# Patient Record
Sex: Female | Born: 1975 | Race: White | Hispanic: No | Marital: Single | Smoking: Former smoker
Health system: Southern US, Community
[De-identification: ages and names within clinical notes are randomized; demographics above are authoritative.]

## PROBLEM LIST (undated history)

## (undated) DIAGNOSIS — M1990 Unspecified osteoarthritis, unspecified site: Secondary | ICD-10-CM

## (undated) DIAGNOSIS — E559 Vitamin D deficiency, unspecified: Secondary | ICD-10-CM

## (undated) DIAGNOSIS — F419 Anxiety disorder, unspecified: Secondary | ICD-10-CM

## (undated) DIAGNOSIS — J45909 Unspecified asthma, uncomplicated: Secondary | ICD-10-CM

## (undated) DIAGNOSIS — N911 Secondary amenorrhea: Secondary | ICD-10-CM

## (undated) DIAGNOSIS — E669 Obesity, unspecified: Secondary | ICD-10-CM

## (undated) DIAGNOSIS — T7840XA Allergy, unspecified, initial encounter: Secondary | ICD-10-CM

## (undated) DIAGNOSIS — R7303 Prediabetes: Secondary | ICD-10-CM

## (undated) DIAGNOSIS — Z87891 Personal history of nicotine dependence: Secondary | ICD-10-CM

## (undated) DIAGNOSIS — M069 Rheumatoid arthritis, unspecified: Secondary | ICD-10-CM

## (undated) DIAGNOSIS — N950 Postmenopausal bleeding: Secondary | ICD-10-CM

## (undated) DIAGNOSIS — G4730 Sleep apnea, unspecified: Secondary | ICD-10-CM

## (undated) DIAGNOSIS — R5382 Chronic fatigue, unspecified: Secondary | ICD-10-CM

## (undated) DIAGNOSIS — F988 Other specified behavioral and emotional disorders with onset usually occurring in childhood and adolescence: Secondary | ICD-10-CM

## (undated) HISTORY — DX: Allergy, unspecified, initial encounter: T78.40XA

## (undated) HISTORY — DX: Anxiety disorder, unspecified: F41.9

## (undated) HISTORY — PX: OTHER SURGICAL HISTORY: SHX169

---

## 2015-10-20 ENCOUNTER — Ambulatory Visit
Admission: EM | Admit: 2015-10-20 | Discharge: 2015-10-20 | Disposition: A | Discharge status: Home / Self Care | Payer: BLUE CROSS/BLUE SHIELD | Attending: Family Medicine | Admitting: Family Medicine

## 2015-10-20 ENCOUNTER — Encounter: Payer: Self-pay | Admitting: *Deleted

## 2015-10-20 DIAGNOSIS — J101 Influenza due to other identified influenza virus with other respiratory manifestations: Secondary | ICD-10-CM | POA: Diagnosis not present

## 2015-10-20 HISTORY — DX: Unspecified asthma, uncomplicated: J45.909

## 2015-10-20 LAB — RAPID INFLUENZA A&B ANTIGENS
Influenza A (ARMC): DETECTED
Influenza B (ARMC): NOT DETECTED

## 2015-10-20 LAB — RAPID STREP SCREEN (MED CTR MEBANE ONLY): STREPTOCOCCUS, GROUP A SCREEN (DIRECT): NEGATIVE

## 2015-10-20 MED ORDER — GUAIFENESIN-CODEINE 100-10 MG/5ML PO SOLN: 10.0000 mL | Freq: Four times a day (QID) | ORAL | Status: DC | PRN

## 2015-10-20 MED ORDER — OSELTAMIVIR PHOSPHATE 75 MG PO CAPS: 75.0000 mg | ORAL_CAPSULE | Freq: Two times a day (BID) | ORAL | Status: DC

## 2015-10-20 MED ORDER — ALBUTEROL SULFATE HFA 108 (90 BASE) MCG/ACT IN AERS: 1.0000 | INHALATION_SPRAY | Freq: Four times a day (QID) | RESPIRATORY_TRACT | Status: DC | PRN

## 2015-10-20 NOTE — ED Provider Notes (Signed)
CSN: [id number]     Arrival date & time 10/20/15  4098 History   First MD Initiated Contact with Patient 10/20/15 [phone]     Chief Complaint  Patient presents with  . Cough  . Fever  . Sore Throat  . Generalized Body Aches   (Consider location/radiation/quality/duration/timing/severity/associated sxs/prior Treatment) Patient is a 40 y.o. female presenting with cough, fever, pharyngitis, and URI.  Cough Associated symptoms: fever, headaches, myalgias, rhinorrhea, sore throat and wheezing   Fever Associated symptoms: congestion, cough, headaches, myalgias, rhinorrhea and sore throat   Sore Throat Associated symptoms include headaches.  URI Presenting symptoms: congestion, cough, fatigue, fever, rhinorrhea and sore throat   Severity:  Moderate Onset quality:  Sudden Duration:  2 days Timing:  Constant Progression:  Worsening Chronicity:  New Relieved by:  Nothing Ineffective treatments:  OTC medications Associated symptoms: arthralgias, headaches, myalgias and wheezing   Risk factors: sick contacts   Risk factors: not elderly and no immunosuppression     Past Medical History  Diagnosis Date  . Asthma    Past Surgical History  Procedure Laterality Date  . Right hand repair N/A    History reviewed. No pertinent family history. Social History  Substance Use Topics  . Smoking status: Never Smoker   . Smokeless tobacco: None  . Alcohol Use: No   OB History    No data available     Review of Systems  Constitutional: Positive for fever and fatigue.  HENT: Positive for congestion, rhinorrhea and sore throat.   Respiratory: Positive for cough and wheezing.   Musculoskeletal: Positive for myalgias and arthralgias.  Neurological: Positive for headaches.    Allergies  Review of patient's allergies indicates no known allergies.  Home Medications   Prior to Admission medications   Medication Sig Start Date End Date Taking? Authorizing Provider  cetirizine (ZYRTEC) 10  MG tablet Take 10 mg by mouth daily.   Yes Historical Provider, MD  albuterol (PROVENTIL HFA;VENTOLIN HFA) 108 (90 Base) MCG/ACT inhaler Inhale 1-2 puffs into the lungs every 6 (six) hours as needed for wheezing or shortness of breath. 10/20/15   Payton Mccallum, MD  guaiFENesin-codeine 100-10 MG/5ML syrup Take 10 mLs by mouth every 6 (six) hours as needed for cough. 10/20/15   Payton Mccallum, MD  oseltamivir (TAMIFLU) 75 MG capsule Take 1 capsule (75 mg total) by mouth 2 (two) times daily. 10/20/15   Payton Mccallum, MD   Meds Ordered and Administered this Visit  Medications - No data to display  BP 127/78 mmHg  Pulse 110  Temp(Src) 99.9 F (37.7 C) (Oral)  Resp 20  Ht 5' (1.524 m)  Wt 192 lb (87.091 kg)  BMI 37.50 kg/m2  SpO2 97%  LMP 09/26/2015 (Exact Date) No data found.   Physical Exam  Constitutional: She appears well-developed and well-nourished. No distress.  HENT:  Head: Normocephalic and atraumatic.  Right Ear: Tympanic membrane, external ear and ear canal normal.  Left Ear: Tympanic membrane, external ear and ear canal normal.  Nose: Rhinorrhea present. No nose lacerations, sinus tenderness, nasal deformity, septal deviation or nasal septal hematoma. No epistaxis.  No foreign bodies.  Mouth/Throat: Uvula is midline and mucous membranes are normal. Posterior oropharyngeal erythema present. No oropharyngeal exudate.  Eyes: Conjunctivae and EOM are normal. Pupils are equal, round, and reactive to light. Right eye exhibits no discharge. Left eye exhibits no discharge. No scleral icterus.  Neck: Normal range of motion. Neck supple. No thyromegaly present.  Cardiovascular: Normal rate, regular rhythm  and normal heart sounds.   Pulmonary/Chest: Effort normal and breath sounds normal. No respiratory distress. She has no wheezes. She has no rales.  Lymphadenopathy:    She has no cervical adenopathy.  Neurological: She is alert.  Skin: No rash noted. She is not diaphoretic.  Nursing  note and vitals reviewed.   ED Course  Procedures (including critical care time)  Labs Review Labs Reviewed  RAPID STREP SCREEN (NOT AT [facility])  RAPID INFLUENZA A&B ANTIGENS ([facility] ONLY)  CULTURE, GROUP A STREP [facility])    Imaging Review No results found.   Visual Acuity Review  Right Eye Distance:   Left Eye Distance:   Bilateral Distance:    Right Eye Near:   Left Eye Near:    Bilateral Near:         MDM   1. Influenza A    Discharge Medication List as of 10/20/2015 10:09 AM    START taking these medications   Details  albuterol (PROVENTIL HFA;VENTOLIN HFA) 108 (90 Base) MCG/ACT inhaler Inhale 1-2 puffs into the lungs every 6 (six) hours as needed for wheezing or shortness of breath., Starting 10/20/2015, Until Discontinued, Normal    guaiFENesin-codeine 100-10 MG/5ML syrup Take 10 mLs by mouth every 6 (six) hours as needed for cough., Starting 10/20/2015, Until Discontinued, Print    oseltamivir (TAMIFLU) 75 MG capsule Take 1 capsule (75 mg total) by mouth 2 (two) times daily., Starting 10/20/2015, Until Discontinued, Normal       1. Lab results and diagnosis reviewed with patient 2. rx as per orders above; reviewed possible side effects, interactions, risks and benefits  3. Recommend supportive treatment with otc analgesics, increased fluids 4. Follow-up prn if symptoms worsen or don't improve    Payton Mccallum, MD 10/20/15 1014

## 2015-10-20 NOTE — ED Notes (Signed)
Non-productive cough, fever, sore throat, gen. body aches x2 days.

## 2015-10-22 LAB — CULTURE, GROUP A STREP (THRC)

## 2016-06-20 ENCOUNTER — Ambulatory Visit
Admission: EM | Admit: 2016-06-20 | Discharge: 2016-06-20 | Disposition: A | Discharge status: Home / Self Care | Payer: BC Managed Care – PPO | Attending: Family Medicine | Admitting: Family Medicine

## 2016-06-20 ENCOUNTER — Ambulatory Visit (INDEPENDENT_AMBULATORY_CARE_PROVIDER_SITE_OTHER): Payer: BC Managed Care – PPO

## 2016-06-20 DIAGNOSIS — S5012XA Contusion of left forearm, initial encounter: Secondary | ICD-10-CM
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
## 2016-06-20 NOTE — Discharge Instructions (Signed)
Elevate your arm above your heart and ice the area 20 minutes every 2 hours. Wear your splint for comfort and protection. Use ibuprofen or Tylenol for pain control. If you develop increasing pain or numbness, go to the emergency room. If you are not improving within a week, see your primary care physician

## 2016-06-20 NOTE — ED Triage Notes (Signed)
Patient c/o left arm pain. She sates that some fell on top of her last night .

## 2017-07-26 ENCOUNTER — Other Ambulatory Visit: Payer: Self-pay

## 2017-07-26 ENCOUNTER — Ambulatory Visit
Admission: EM | Admit: 2017-07-26 | Discharge: 2017-07-26 | Disposition: A | Discharge status: Home / Self Care | Payer: BC Managed Care – PPO | Attending: Family Medicine | Admitting: Family Medicine

## 2017-07-26 ENCOUNTER — Encounter: Payer: Self-pay | Admitting: Emergency Medicine

## 2017-07-26 DIAGNOSIS — J069 Acute upper respiratory infection, unspecified: Secondary | ICD-10-CM | POA: Diagnosis not present

## 2017-07-26 DIAGNOSIS — J029 Acute pharyngitis, unspecified: Secondary | ICD-10-CM | POA: Diagnosis not present

## 2017-07-26 DIAGNOSIS — R05 Cough: Secondary | ICD-10-CM | POA: Diagnosis not present

## 2017-07-26 LAB — RAPID STREP SCREEN (MED CTR MEBANE ONLY): STREPTOCOCCUS, GROUP A SCREEN (DIRECT): NEGATIVE

## 2017-07-26 MED ORDER — FLUTICASONE PROPIONATE 50 MCG/ACT NA SUSP: 2.0000 | Freq: Every day | NASAL | 0 refills | Status: DC

## 2017-07-26 MED ORDER — ALBUTEROL SULFATE HFA 108 (90 BASE) MCG/ACT IN AERS: 1.0000 | INHALATION_SPRAY | Freq: Four times a day (QID) | RESPIRATORY_TRACT | 0 refills | Status: DC | PRN

## 2017-07-26 MED ORDER — HYDROCOD POLST-CPM POLST ER 10-8 MG/5ML PO SUER: 5.0000 mL | Freq: Two times a day (BID) | ORAL | 0 refills | Status: DC | PRN

## 2017-07-26 NOTE — ED Provider Notes (Addendum)
[facility]   CSN: [id number] Arrival date & time: 07/26/17  [id number]  History   Chief Complaint Chief Complaint  Patient presents with  . Sinus Problem  . Nasal Congestion  . Cough   HPI  41 year old female presents with the above complaints.  She reports that she has been sick since Sunday.  She has had sore throat, cough, ear pain, congestion, runny nose.  No documented fever.  She is taking over-the-counter Advil Cold and Sinus with improvement.  She feels like her symptoms are worsening however.  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  Symptoms are moderate in severity.  No known exacerbating factors.  No other reported symptoms.  No other complaints at this time.  Past Medical History:  Diagnosis Date  . Asthma    Past Surgical History:  Procedure Laterality Date  . right hand repair N/A    OB History    No data available     Home Medications    Prior to Admission medications   Medication Sig Start Date End Date Taking? Authorizing Provider  cetirizine (ZYRTEC) 10 MG tablet Take 10 mg by mouth daily.   Yes [provider]  albuterol (PROVENTIL HFA;VENTOLIN HFA) 108 (90 Base) MCG/ACT inhaler Inhale 1-2 puffs into the lungs every 6 (six) hours as needed for wheezing or shortness of breath. 07/26/17   Tommie Samsook, Autumne Kallio G, DO  chlorpheniramine-HYDROcodone (TUSSIONEX PENNKINETIC ER) 10-8 MG/5ML SUER Take 5 mLs by mouth every 12 (twelve) hours as needed. 07/26/17   Tommie Samsook, Aanyah Loa G, DO  fluticasone (FLONASE) 50 MCG/ACT nasal spray Place 2 sprays into both nostrils daily. 07/26/17   Tommie Samsook, Camille Dragan G, DO   Family History Family History  Problem Relation Age of Onset  . Hypertension Father    Social History Social History   Tobacco Use  . Smoking status: Never Smoker  . Smokeless tobacco: Never Used  Substance Use Topics  . Alcohol use: No  . Drug use: No   Allergies   Patient has no known allergies.  Review of Systems Review of  Systems  Constitutional: Negative for fever.  HENT: Positive for congestion, ear pain, rhinorrhea and sore throat.   Respiratory: Positive for cough.    Physical Exam Triage Vital Signs ED Triage Vitals  Enc Vitals Group     BP 07/26/17 1015 (!) 118/52     Pulse Rate 07/26/17 1015 71     Resp 07/26/17 1015 16     Temp 07/26/17 1015 97.8 F (36.6 C)     Temp Source 07/26/17 1015 Oral     SpO2 07/26/17 1015 97 %     Weight 07/26/17 1011 184 lb 15.5 oz (83.9 kg)     Height 07/26/17 1011 5' (1.524 m)     Head Circumference --      Peak Flow --      Pain Score 07/26/17 1011 2     Pain Loc --      Pain Edu? --      Excl. in GC? --    No data found.  Updated Vital Signs BP (!) 118/52 (BP Location: Left Arm)   Pulse 71   Temp 97.8 F (36.6 C) (Oral)   Resp 16   Ht 5' (1.524 m)   Wt 184 lb 15.5 oz (83.9 kg)   LMP 07/12/2017 (Approximate)   SpO2 97%   BMI 36.12 kg/m    Physical Exam  Constitutional: She is oriented to  person, place, and time. She appears well-developed. No distress.  HENT:  Head: Normocephalic and atraumatic.  Oropharynx with mild erythema.  No exudate. Normal TM's bilaterally.   Eyes: Conjunctivae are normal.  Neck: Neck supple.  Cardiovascular: Normal rate and regular rhythm.  No murmur heard. Pulmonary/Chest: Effort normal and breath sounds normal. She has no wheezes. She has no rales.  Lymphadenopathy:    She has no cervical adenopathy.  Neurological: She is alert and oriented to person, place, and time. She exhibits normal muscle tone.  Psychiatric: She has a normal mood and affect. Her behavior is normal.  Vitals reviewed.  UC Treatments / Results  Labs (all labs ordered are listed, but only abnormal results are displayed) Labs Reviewed  RAPID STREP SCREEN (NOT AT [facility])  CULTURE, GROUP A STREP [facility])   EKG  EKG Interpretation None      Radiology No results found.  Procedures Procedures (including critical care time)  Medications  Ordered in UC Medications - No data to display   Initial Impression / Assessment and Plan / UC Course  I have reviewed the triage vital signs and the nursing notes.  Pertinent labs & imaging results that were available during my care of the patient were reviewed by me and considered in my medical decision making (see chart for details).    41 year old female presents with a URI. New acute problem. Rapid strep negative.  Treating with flonase, tussionex.  Final Clinical Impressions(s) / UC Diagnoses   Final diagnoses:  Viral upper respiratory tract infection   ED Discharge Orders        Ordered    fluticasone (FLONASE) 50 MCG/ACT nasal spray  Daily     07/26/17 1043    chlorpheniramine-HYDROcodone (TUSSIONEX PENNKINETIC ER) 10-8 MG/5ML SUER  Every 12 hours PRN     07/26/17 1043    albuterol (PROVENTIL HFA;VENTOLIN HFA) 108 (90 Base) MCG/ACT inhaler  Every 6 hours PRN     07/26/17 1051     Controlled Substance Prescriptions [place] Controlled Substance Registry consulted? Not Applicable   Tommie SamsCook, Icker Swigert G, DO 07/26/17 1054    Everlene Otherook, Churchill Grimsley G, DO 07/26/17 1055

## 2017-07-26 NOTE — ED Triage Notes (Signed)
Patient c/o sinus congestion and pressure, cough and nasal congestion since Sunday.

## 2017-07-29 LAB — CULTURE, GROUP A STREP (THRC)

## 2017-12-14 ENCOUNTER — Other Ambulatory Visit: Payer: Self-pay

## 2017-12-14 ENCOUNTER — Ambulatory Visit
Admission: EM | Admit: 2017-12-14 | Discharge: 2017-12-14 | Disposition: A | Discharge status: Home / Self Care | Payer: BC Managed Care – PPO | Attending: Family Medicine | Admitting: Family Medicine

## 2017-12-14 ENCOUNTER — Ambulatory Visit (INDEPENDENT_AMBULATORY_CARE_PROVIDER_SITE_OTHER): Payer: BC Managed Care – PPO

## 2017-12-14 DIAGNOSIS — S99922A Unspecified injury of left foot, initial encounter: Secondary | ICD-10-CM

## 2017-12-14 DIAGNOSIS — W19XXXA Unspecified fall, initial encounter: Secondary | ICD-10-CM | POA: Diagnosis not present

## 2017-12-14 DIAGNOSIS — M79672 Pain in left foot: Secondary | ICD-10-CM

## 2017-12-14 HISTORY — DX: Other specified behavioral and emotional disorders with onset usually occurring in childhood and adolescence: F98.8

## 2017-12-14 NOTE — Discharge Instructions (Signed)
Rest, ice, elevation. ° °Ibuprofen as needed. ° °Take care ° °Dr. Abhiram Criado °

## 2017-12-14 NOTE — ED Provider Notes (Signed)
[facility]    CSN: [id number] Arrival date & time: 12/14/17  1220  History   Chief Complaint Chief Complaint  Patient presents with  . Foot Injury   HPI  42 year old female presents with a foot injury.  Patient states that this morning she had a side board of a bed fall and injure her left foot.  She reports pain of the dorsum of the left foot particularly the lateral aspect.  She has a small cut as well.  Decreased range of motion.  Pain is currently 3/10 in severity.  No medications or interventions tried.  Worse with activity.  No relieving factors.  No other complaints or concerns at this time.  Past Medical History:  Diagnosis Date  . ADD (attention deficit disorder)   . Asthma    Past Surgical History:  Procedure Laterality Date  . right hand repair N/A    OB History   None    Home Medications    Prior to Admission medications   Not on File   Family History Family History  Problem Relation Age of Onset  . Hypertension Father    Social History Social History   Tobacco Use  . Smoking status: Never Smoker  . Smokeless tobacco: Never Used  Substance Use Topics  . Alcohol use: Yes    Comment: social  . Drug use: No     Allergies   Patient has no known allergies.   Review of Systems Review of Systems  Constitutional: Negative.   Musculoskeletal:       Left foot injury, pain.  Skin: Positive for wound.   Physical Exam Triage Vital Signs ED Triage Vitals  Enc Vitals Group     BP 12/14/17 1234 126/70     Pulse Rate 12/14/17 1234 84     Resp 12/14/17 1234 18     Temp 12/14/17 1234 97.9 F (36.6 C)     Temp Source 12/14/17 1234 Oral     SpO2 12/14/17 1234 99 %     Weight --      Height 12/14/17 1235 5' (1.524 m)     Head Circumference --      Peak Flow --      Pain Score 12/14/17 1234 3     Pain Loc --      Pain Edu? --      Excl. in GC? --    Updated Vital Signs BP 126/70 (BP Location: Right Arm)   Pulse 84   Temp 97.9  F (36.6 C) (Oral)   Resp 18   Ht 5' (1.524 m)   LMP 12/05/2017 Comment: denies preg  SpO2 99%   BMI 36.12 kg/m   Physical Exam  Constitutional: She is oriented to person, place, and time. She appears well-developed. No distress.  Cardiovascular: Normal rate and regular rhythm.  Pulmonary/Chest: Effort normal and breath sounds normal. She has no wheezes. She has no rales.  Musculoskeletal:  Left foot - tenderness on the dorsum and the plantar aspect of the foot (distally).  Neurological: She is alert and oriented to person, place, and time.  Skin:  Patient has a small superficial cut at the lateral aspect of the left foot (4th-5th MTP area).  Psychiatric: She has a normal mood and affect. Her behavior is normal.  Nursing note and vitals reviewed.  UC Treatments / Results  Labs (all labs ordered are listed, but only abnormal results are displayed) Labs Reviewed - No data to display  EKG None  Radiology Dg Foot Complete Left  Result Date: 12/14/2017 CLINICAL DATA:  Blunt trauma left foot with pain, initial encounter EXAM: LEFT FOOT - COMPLETE 3+ VIEW COMPARISON:  None. FINDINGS: There is no evidence of fracture or dislocation. There is no evidence of arthropathy or other focal bone abnormality. Soft tissues are unremarkable. IMPRESSION: No acute abnormality noted. Electronically Signed   By: Alcide CleverMark  Lukens M.D.   On: 12/14/2017 13:29    Procedures Procedures (including critical care time)  Medications Ordered in UC Medications - No data to display   Initial Impression / Assessment and Plan / UC Course  I have reviewed the triage vital signs and the nursing notes.  Pertinent labs & imaging results that were available during my care of the patient were reviewed by me and considered in my medical decision making (see chart for details).    42 year old female presents with a foot injury. Xray negative. OTC Ibuprofen as needed. Rest, Ice, Elevation.  Final Clinical  Impressions(s) / UC Diagnoses   Final diagnoses:  Injury of left foot, initial encounter    ED Discharge Orders    None     Controlled Substance Prescriptions [place] Controlled Substance Registry consulted? Not Applicable   Tommie SamsCook, Sahalie Beth G, DO 12/14/17 1359

## 2017-12-14 NOTE — ED Triage Notes (Signed)
Pt reports her headboard fell over on top of her left foot. Pain 3/10

## 2018-04-02 ENCOUNTER — Encounter: Payer: Self-pay | Admitting: Emergency Medicine

## 2018-04-02 ENCOUNTER — Ambulatory Visit
Admission: EM | Admit: 2018-04-02 | Discharge: 2018-04-02 | Disposition: A | Discharge status: Home / Self Care | Payer: BC Managed Care – PPO | Attending: Family Medicine | Admitting: Family Medicine

## 2018-04-02 ENCOUNTER — Other Ambulatory Visit: Payer: Self-pay

## 2018-04-02 DIAGNOSIS — R05 Cough: Secondary | ICD-10-CM | POA: Diagnosis not present

## 2018-04-02 DIAGNOSIS — J029 Acute pharyngitis, unspecified: Secondary | ICD-10-CM | POA: Diagnosis not present

## 2018-04-02 LAB — RAPID STREP SCREEN (MED CTR MEBANE ONLY): STREPTOCOCCUS, GROUP A SCREEN (DIRECT): NEGATIVE

## 2018-04-02 MED ORDER — AMOXICILLIN 875 MG PO TABS: 875.0000 mg | ORAL_TABLET | Freq: Two times a day (BID) | ORAL | 0 refills | Status: DC

## 2018-04-02 MED ORDER — CETIRIZINE-PSEUDOEPHEDRINE ER 5-120 MG PO TB12: 1.0000 | ORAL_TABLET | Freq: Two times a day (BID) | ORAL | 0 refills | Status: DC

## 2018-04-02 NOTE — ED Triage Notes (Signed)
Patient c/o sore throat that started this morning.  Patient denies fevers.  

## 2018-04-02 NOTE — ED Provider Notes (Signed)
[facility]    CSN: [id number] Arrival date & time: 04/02/18  1219     History   Chief Complaint Chief Complaint  Patient presents with  . Sore Throat    HPI Stacy Ross is a 42 y.o. female.   HPI  42 year old female presents with a sore throat that started this morning.  Denies Any fevers.  She has  a mild cough and drainage.  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  Afebrile today.  Did have chills last night.        Past Medical History:  Diagnosis Date  . ADD (attention deficit disorder)   . Asthma     There are no active problems to display for this patient.   Past Surgical History:  Procedure Laterality Date  . right hand repair N/A     OB History   None      Home Medications    Prior to Admission medications   Medication Sig Start Date End Date Taking? Authorizing Provider  amoxicillin (AMOXIL) 875 MG tablet Take 1 tablet (875 mg total) by mouth 2 (two) times daily. 04/02/18   Lutricia Feiloemer, William P, PA-C  cetirizine-pseudoephedrine (ZYRTEC-D) 5-120 MG tablet Take 1 tablet by mouth 2 (two) times daily. 04/02/18   Lutricia Feiloemer, William P, PA-C    Family History Family History  Problem Relation Age of Onset  . Hypertension Father     Social History Social History   Tobacco Use  . Smoking status: Never Smoker  . Smokeless tobacco: Never Used  Substance Use Topics  . Alcohol use: Yes    Comment: social  . Drug use: No     Allergies   Patient has no known allergies.   Review of Systems Review of Systems  Constitutional: Positive for activity change and chills. Negative for fatigue and fever.  HENT: Positive for congestion, postnasal drip and sore throat.   Respiratory: Positive for cough.   All other systems reviewed and are negative.    Physical Exam Triage Vital Signs ED Triage Vitals  Enc Vitals Group     BP 04/02/18 1230 113/78     Pulse  Rate 04/02/18 1230 85     Resp 04/02/18 1230 16     Temp 04/02/18 1230 98.3 F (36.8 C)     Temp Source 04/02/18 1230 Oral     SpO2 04/02/18 1230 99 %     Weight 04/02/18 1227 190 lb (86.2 kg)     Height 04/02/18 1227 5' (1.524 m)     Head Circumference --      Peak Flow --      Pain Score 04/02/18 1227 0     Pain Loc --      Pain Edu? --      Excl. in GC? --    No data found.  Updated Vital Signs BP 113/78 (BP Location: Right Arm)   Pulse 85   Temp 98.3 F (36.8 C) (Oral)   Resp 16   Ht 5' (1.524 m)   Wt 190 lb (86.2 kg)   LMP 02/27/2018 (Exact Date)   SpO2 99%   BMI 37.11 kg/m   Visual Acuity Right Eye Distance:   Left Eye Distance:   Bilateral Distance:    Right Eye Near:   Left Eye Near:    Bilateral Near:     Physical Exam  Constitutional: She is  oriented to person, place, and time. She appears well-developed and well-nourished.  Non-toxic appearance. She does not appear ill. No distress.  HENT:  Head: Normocephalic.  Right Ear: Hearing, tympanic membrane and ear canal normal.  Left Ear: Hearing, tympanic membrane and ear canal normal.  Mouth/Throat: Oropharynx is clear and moist and mucous membranes are normal. No oral lesions. No uvula swelling. No oropharyngeal exudate, posterior oropharyngeal edema, posterior oropharyngeal erythema or tonsillar abscesses. Tonsils are 0 on the right. Tonsils are 1+ on the left. No tonsillar exudate.  Eyes: Pupils are equal, round, and reactive to light.  Neck: Normal range of motion.  Pulmonary/Chest: Effort normal and breath sounds normal.  Neurological: She is alert and oriented to person, place, and time.  Skin: Skin is warm and dry.  Psychiatric: She has a normal mood and affect. Her behavior is normal.  Nursing note and vitals reviewed.    UC Treatments / Results  Labs (all labs ordered are listed, but only abnormal results are displayed) Labs Reviewed  RAPID STREP SCREEN ([facility] ONLY)  CULTURE,  GROUP A STREP [facility])    EKG None  Radiology No results found.  Procedures Procedures (including critical care time)  Medications Ordered in UC Medications - No data to display  Initial Impression / Assessment and Plan / UC Course  I have reviewed the triage vital signs and the nursing notes.  Pertinent labs & imaging results that were available during my care of the patient were reviewed by me and considered in my medical decision making (see chart for details).     Plan: 1. Test/x-ray results and diagnosis reviewed with patient 2. rx as per orders; risks, benefits, potential side effects reviewed with patient 3. Recommend supportive treatment with use of Flonase daily and Zyrtec-D.  She is leaving on a trip to [place] driving at the [employer] [place].  As a courtesy I will prescribe amoxicillin if the cultures and sensitivities returned as a positive for strep.  If it is not strep, I have told her that it is a viral pharyngitis and will have to run its course. 4. F/u prn if symptoms worsen or don't improve  Final Clinical Impressions(s) / UC Diagnoses   Final diagnoses:  Sore throat     Discharge Instructions     Use Flonase daily.  Take Zyrtec-D for 1 to 2 weeks for congestion.  Use amoxicillin as prescribed if you are notified that the cultures have been positive otherwise this is likely a viral illness does not require antibiotics.    ED Prescriptions    Medication Sig Dispense Auth. Provider   amoxicillin (AMOXIL) 875 MG tablet Take 1 tablet (875 mg total) by mouth 2 (two) times daily. 20 tablet Ovid Curd P, PA-C   cetirizine-pseudoephedrine (ZYRTEC-D) 5-120 MG tablet Take 1 tablet by mouth 2 (two) times daily. 30 tablet Lutricia Feil, PA-C     Controlled Substance Prescriptions [place] Controlled Substance Registry consulted? Not Applicable   Lutricia Feil, PA-C 04/02/18 1332

## 2018-04-02 NOTE — Discharge Instructions (Signed)
Use Flonase daily.  Take Zyrtec-D for 1 to 2 weeks for congestion.  Use amoxicillin as prescribed if you are notified that the cultures have been positive otherwise this is likely a viral illness does not require antibiotics.

## 2018-04-05 LAB — CULTURE, GROUP A STREP (THRC)

## 2019-04-29 ENCOUNTER — Other Ambulatory Visit: Payer: Self-pay

## 2019-04-29 DIAGNOSIS — Z20822 Contact with and (suspected) exposure to covid-19: Secondary | ICD-10-CM

## 2019-04-30 LAB — NOVEL CORONAVIRUS, NAA: SARS-CoV-2, NAA: NOT DETECTED

## 2019-07-11 ENCOUNTER — Other Ambulatory Visit: Payer: Self-pay

## 2019-07-11 DIAGNOSIS — Z20822 Contact with and (suspected) exposure to covid-19: Secondary | ICD-10-CM

## 2019-07-12 LAB — NOVEL CORONAVIRUS, NAA: SARS-CoV-2, NAA: NOT DETECTED

## 2021-04-12 ENCOUNTER — Other Ambulatory Visit: Payer: Self-pay

## 2021-04-12 ENCOUNTER — Ambulatory Visit
Admission: EM | Admit: 2021-04-12 | Discharge: 2021-04-12 | Disposition: A | Discharge status: Home / Self Care | Payer: BC Managed Care – PPO | Attending: Family Medicine | Admitting: Family Medicine

## 2021-04-12 DIAGNOSIS — F17210 Nicotine dependence, cigarettes, uncomplicated: Secondary | ICD-10-CM | POA: Insufficient documentation

## 2021-04-12 DIAGNOSIS — J029 Acute pharyngitis, unspecified: Secondary | ICD-10-CM | POA: Insufficient documentation

## 2021-04-12 DIAGNOSIS — R197 Diarrhea, unspecified: Secondary | ICD-10-CM | POA: Insufficient documentation

## 2021-04-12 DIAGNOSIS — Z20822 Contact with and (suspected) exposure to covid-19: Secondary | ICD-10-CM | POA: Insufficient documentation

## 2021-04-12 DIAGNOSIS — R112 Nausea with vomiting, unspecified: Secondary | ICD-10-CM | POA: Insufficient documentation

## 2021-04-12 DIAGNOSIS — H9203 Otalgia, bilateral: Secondary | ICD-10-CM | POA: Diagnosis not present

## 2021-04-12 DIAGNOSIS — J069 Acute upper respiratory infection, unspecified: Secondary | ICD-10-CM | POA: Insufficient documentation

## 2021-04-12 LAB — SARS CORONAVIRUS 2 (TAT 6-24 HRS): SARS Coronavirus 2: NEGATIVE

## 2021-04-12 LAB — GROUP A STREP BY PCR: Group A Strep by PCR: NOT DETECTED

## 2021-04-12 MED ORDER — BENZONATATE 100 MG PO CAPS: 200.0000 mg | ORAL_CAPSULE | Freq: Three times a day (TID) | ORAL | 0 refills | Status: DC

## 2021-04-12 MED ORDER — IPRATROPIUM BROMIDE 0.06 % NA SOLN: 2.0000 | Freq: Four times a day (QID) | NASAL | 12 refills | Status: DC

## 2021-04-12 MED ORDER — ONDANSETRON 8 MG PO TBDP: 8.0000 mg | ORAL_TABLET | Freq: Three times a day (TID) | ORAL | 0 refills | Status: DC | PRN

## 2021-04-12 MED ORDER — PROMETHAZINE-DM 6.25-15 MG/5ML PO SYRP: 5.0000 mL | ORAL_SOLUTION | Freq: Four times a day (QID) | ORAL | 0 refills | Status: DC | PRN

## 2021-04-12 NOTE — ED Provider Notes (Signed)
[facility]    CSN: [id number] Arrival date & time: 04/12/21  0845      History   Chief Complaint Chief Complaint  Patient presents with   Sore Throat   Fever    HPI Stacy Ross is a 45 y.o. female.   HPI  45 year old female here for evaluation of sore throat and fever.  Patient reports that she developed a sore throat and a fever of 101.12 days ago.  Since then she has also developed fatigue, nasal congestion, bilateral ear pain, nonproductive cough, nausea, vomiting, and diarrhea.  She denies any runny nose, shortness of breath, or wheezing.  She did have COVID at the end of April and she is vaccinated.  Past Medical History:  Diagnosis Date   ADD (attention deficit disorder)    Asthma     There are no problems to display for this patient.   Past Surgical History:  Procedure Laterality Date   right hand repair N/A     OB History   No obstetric history on file.      Home Medications    Prior to Admission medications   Medication Sig Start Date End Date Taking? Authorizing Provider  benzonatate (TESSALON) 100 MG capsule Take 2 capsules (200 mg total) by mouth every 8 (eight) hours. 04/12/21  Yes Becky Augusta, NP  cetirizine-pseudoephedrine (ZYRTEC-D) 5-120 MG tablet Take 1 tablet by mouth 2 (two) times daily. 04/02/18  Yes Ovid Curd P, PA-C  ipratropium (ATROVENT) 0.06 % nasal spray Place 2 sprays into both nostrils 4 (four) times daily. 04/12/21  Yes Becky Augusta, NP  ondansetron (ZOFRAN ODT) 8 MG disintegrating tablet Take 1 tablet (8 mg total) by mouth every 8 (eight) hours as needed for nausea or vomiting. 04/12/21  Yes Becky Augusta, NP  promethazine-dextromethorphan (PROMETHAZINE-DM) 6.25-15 MG/5ML syrup Take 5 mLs by mouth 4 (four) times daily as needed. 04/12/21  Yes Becky Augusta, NP    Family History Family History  Problem Relation Age of Onset   Hypertension Father     Social History Social History   Tobacco Use   Smoking status:  Every Day    Types: Cigarettes   Smokeless tobacco: Never  Vaping Use   Vaping Use: Never used  Substance Use Topics   Alcohol use: Yes    Comment: social   Drug use: No     Allergies   Patient has no known allergies.   Review of Systems Review of Systems  Constitutional:  Positive for fatigue and fever. Negative for activity change and appetite change.  HENT:  Positive for congestion, ear pain and sore throat. Negative for rhinorrhea.   Respiratory:  Positive for cough. Negative for shortness of breath and wheezing.   Gastrointestinal:  Positive for diarrhea, nausea and vomiting.  Skin:  Negative for rash.  Hematological: Negative.   Psychiatric/Behavioral: Negative.      Physical Exam Triage Vital Signs ED Triage Vitals  Enc Vitals Group     BP 04/12/21 0935 139/89     Pulse Rate 04/12/21 0935 77     Resp 04/12/21 0935 16     Temp 04/12/21 0935 98.7 F (37.1 C)     Temp Source 04/12/21 0935 Oral     SpO2 04/12/21 0935 96 %     Weight 04/12/21 0933 200 lb (90.7 kg)     Height 04/12/21 0933 5' (1.524 m)     Head Circumference --      Peak Flow --  Pain Score 04/12/21 0933 1     Pain Loc --      Pain Edu? --      Excl. in GC? --    No data found.  Updated Vital Signs BP 139/89 (BP Location: Left Arm)   Pulse 77   Temp 98.7 F (37.1 C) (Oral)   Resp 16   Ht 5' (1.524 m)   Wt 200 lb (90.7 kg)   LMP 12/14/2020   SpO2 96%   BMI 39.06 kg/m   Visual Acuity Right Eye Distance:   Left Eye Distance:   Bilateral Distance:    Right Eye Near:   Left Eye Near:    Bilateral Near:     Physical Exam Vitals and nursing note reviewed.  Constitutional:      General: She is not in acute distress.    Appearance: Normal appearance. She is obese. She is not ill-appearing.  HENT:     Head: Normocephalic and atraumatic.     Right Ear: Tympanic membrane, ear canal and external ear normal. There is no impacted cerumen.     Left Ear: Tympanic membrane, ear canal  and external ear normal. There is no impacted cerumen.     Nose: Congestion present. No rhinorrhea.     Mouth/Throat:     Mouth: Mucous membranes are moist.     Pharynx: Oropharynx is clear. Posterior oropharyngeal erythema present. No oropharyngeal exudate.  Cardiovascular:     Rate and Rhythm: Normal rate and regular rhythm.     Pulses: Normal pulses.     Heart sounds: Normal heart sounds. No murmur heard.   No gallop.  Pulmonary:     Effort: Pulmonary effort is normal.     Breath sounds: No wheezing, rhonchi or rales.  Musculoskeletal:     Cervical back: Normal range of motion and neck supple.  Lymphadenopathy:     Cervical: Cervical adenopathy present.  Skin:    General: Skin is warm and dry.     Capillary Refill: Capillary refill takes less than 2 seconds.     Findings: No erythema or rash.  Neurological:     General: No focal deficit present.     Mental Status: She is alert and oriented to person, place, and time.  Psychiatric:        Mood and Affect: Mood normal.        Behavior: Behavior normal.        Thought Content: Thought content normal.        Judgment: Judgment normal.     UC Treatments / Results  Labs (all labs ordered are listed, but only abnormal results are displayed) Labs Reviewed  GROUP A STREP BY PCR  SARS CORONAVIRUS 2 (TAT 6-24 HRS)    EKG   Radiology No results found.  Procedures Procedures (including critical care time)  Medications Ordered in UC Medications - No data to display  Initial Impression / Assessment and Plan / UC Course  I have reviewed the triage vital signs and the nursing notes.  Pertinent labs & imaging results that were available during my care of the patient were reviewed by me and considered in my medical decision making (see chart for details).  Patient is a very pleasant, nontoxic-appearing 45 year old female here for evaluation of COVID/flulike symptoms that been going on for the past 2 days.  Patient reports  that she started feeling bad 3 days ago, took a home COVID test which was negative, and then went to a party  with the [facility] [employer] hot football team.  When she returned home from the party she states that she was profoundly fatigued and then woke up with 101.1 fever, sore throat, nausea, vomiting, and diarrhea.  Since then she has developed nasal congestion and bilateral ear pain that she is rating is a 1/10.  Today she developed a nonproductive cough.  She denies runny nose, shortness breath, or wheezing.  Patient's physical exam reveals pearly gray tympanic membranes bilaterally with a normal light reflex and clear external auditory canals.  Nasal mucosa is mildly erythematous edematous without discharge.  Oropharyngeal exam reveals 2+ tonsillar pillar edema with erythema but no exudate.  There is no postnasal drip or posterior oropharyngeal erythema or injection noted.  Patient does have bilateral mild cervical lymphadenopathy anteriorly.  Cardiopulmonary exam reveals clear lung sounds in all fields.  No rashes noted.  Patient swab for COVID at triage as well as a strep PCR was collected.  Strep PCR is negative.  Will discharge patient home to isolate pending result of her COVID test.  We will give patient Zofran to help with nausea and vomiting, Atrovent nasal spray to help with nasal congestion, Tessalon Perles and Promethazine DM to help with cough and congestion.   Final Clinical Impressions(s) / UC Diagnoses   Final diagnoses:  Upper respiratory tract infection, unspecified type     Discharge Instructions      Isolate at home pending the results of your COVID test.  If you test positive then you will have to quarantine for 5 days from the start of your symptoms.  After 5 days you can break quarantine if your symptoms have improved and you have not had a fever for 24 hours without taking Tylenol or ibuprofen.  Use over-the-counter Tylenol and ibuprofen as needed for body aches and fever.  Use  the Atrovent nasal spray, 2 squirts up each nostril every 6 hours, as needed for nasal congestion.  Use the Tessalon Perles during the day as needed for cough and the Promethazine DM cough syrup at nighttime as will make you drowsy.  Use the Zofran ODT every 8 hours as needed for nausea and vomiting.  If you develop any increased shortness of breath-especially at rest, you are unable to speak in full sentences, or is a late sign your lips are turning blue you need to go the ER for evaluation.      ED Prescriptions     Medication Sig Dispense Auth. Provider   benzonatate (TESSALON) 100 MG capsule Take 2 capsules (200 mg total) by mouth every 8 (eight) hours. 21 capsule Becky Augusta, NP   ipratropium (ATROVENT) 0.06 % nasal spray Place 2 sprays into both nostrils 4 (four) times daily. 15 mL Becky Augusta, NP   promethazine-dextromethorphan (PROMETHAZINE-DM) 6.25-15 MG/5ML syrup Take 5 mLs by mouth 4 (four) times daily as needed. 118 mL Becky Augusta, NP   ondansetron (ZOFRAN ODT) 8 MG disintegrating tablet Take 1 tablet (8 mg total) by mouth every 8 (eight) hours as needed for nausea or vomiting. 20 tablet Becky Augusta, NP      PDMP not reviewed this encounter.   Becky Augusta, NP 04/12/21 1042

## 2021-04-12 NOTE — Discharge Instructions (Addendum)
Isolate at home pending the results of your COVID test.  If you test positive then you will have to quarantine for 5 days from the start of your symptoms.  After 5 days you can break quarantine if your symptoms have improved and you have not had a fever for 24 hours without taking Tylenol or ibuprofen.  Use over-the-counter Tylenol and ibuprofen as needed for body aches and fever.  Use the Atrovent nasal spray, 2 squirts up each nostril every 6 hours, as needed for nasal congestion.  Use the Tessalon Perles during the day as needed for cough and the Promethazine DM cough syrup at nighttime as will make you drowsy.  Use the Zofran ODT every 8 hours as needed for nausea and vomiting.  If you develop any increased shortness of breath-especially at rest, you are unable to speak in full sentences, or is a late sign your lips are turning blue you need to go the ER for evaluation.

## 2021-04-12 NOTE — ED Triage Notes (Signed)
Pt here with C/O sore throat since Saturday took at home test was negative, started running a fever saturday night.

## 2021-05-24 ENCOUNTER — Ambulatory Visit: Payer: BC Managed Care – PPO | Admitting: Family Medicine

## 2021-05-24 ENCOUNTER — Encounter: Payer: Self-pay | Admitting: Family Medicine

## 2021-05-24 ENCOUNTER — Other Ambulatory Visit: Payer: Self-pay

## 2021-05-24 VITALS — BP 120/80 | HR 76 | Ht 59.0 in | Wt 210.0 lb

## 2021-05-24 DIAGNOSIS — R5383 Other fatigue: Secondary | ICD-10-CM | POA: Diagnosis not present

## 2021-05-24 DIAGNOSIS — N912 Amenorrhea, unspecified: Secondary | ICD-10-CM

## 2021-05-24 DIAGNOSIS — Z23 Encounter for immunization: Secondary | ICD-10-CM

## 2021-05-24 DIAGNOSIS — R635 Abnormal weight gain: Secondary | ICD-10-CM | POA: Diagnosis not present

## 2021-05-24 DIAGNOSIS — Z7689 Persons encountering health services in other specified circumstances: Secondary | ICD-10-CM | POA: Diagnosis not present

## 2021-05-24 NOTE — Progress Notes (Signed)
Date:  05/24/2021   Name:  Stacy Ross   DOB:  [birth year]   MRN:  [id number]   Chief Complaint: Establish Care  Patient is a 45 year old female who presents for a comprehensive physical exam. The patient reports the following problems: none. Health maintenance has been reviewed need pap     No results found for: CREATININE, BUN, NA, K, CL, CO2 No results found for: CHOL, HDL, LDLCALC, LDLDIRECT, TRIG, CHOLHDL No results found for: TSH No results found for: HGBA1C No results found for: WBC, HGB, HCT, MCV, PLT No results found for: ALT, AST, GGT, ALKPHOS, BILITOT   Review of Systems  Constitutional:  Negative for chills and fever.  HENT:  Negative for drooling, ear discharge, ear pain and sore throat.   Respiratory:  Negative for cough, shortness of breath and wheezing.   Cardiovascular:  Negative for chest pain, palpitations and leg swelling.  Gastrointestinal:  Negative for abdominal pain, blood in stool, constipation, diarrhea and nausea.  Endocrine: Negative for polydipsia.  Genitourinary:  Negative for dysuria, frequency, hematuria and urgency.  Musculoskeletal:  Negative for back pain, myalgias and neck pain.  Skin:  Negative for rash.  Allergic/Immunologic: Negative for environmental allergies.  Neurological:  Negative for dizziness and headaches.  Hematological:  Does not bruise/bleed easily.  Psychiatric/Behavioral:  Negative for suicidal ideas. The patient is not nervous/anxious.    There are no problems to display for this patient.   No Known Allergies  Past Surgical History:  Procedure Laterality Date   right hand repair N/A     Social History   Tobacco Use   Smoking status: Former    Packs/day: 0.25    Years: 10.00    Pack years: 2.50    Types: Cigarettes    Quit date: 03/05/2021    Years since quitting: 0.2   Smokeless tobacco: Never  Vaping Use   Vaping Use: Never used  Substance Use Topics   Alcohol use: Yes    Comment: social   Drug use: No      Medication list has been reviewed and updated.  No outpatient medications have been marked as taking for the 05/24/21 encounter (Office Visit) with Duanne Limerick, MD.    [facility] 2/9 Scores 05/24/2021  PHQ - 2 Score 0  PHQ- 9 Score 0    GAD 7 : Generalized Anxiety Score 05/24/2021  Nervous, Anxious, on Edge 2  Control/stop worrying 1  Worry too much - different things 2  Trouble relaxing 1  Restless 1  Easily annoyed or irritable 1  Afraid - awful might happen 1  Total GAD 7 Score 9  Anxiety Difficulty Very difficult    BP Readings from Last 3 Encounters:  05/24/21 120/80  04/12/21 139/89  04/02/18 113/78    Physical Exam Vitals and nursing note reviewed.    Wt Readings from Last 3 Encounters:  05/24/21 210 lb (95.3 kg)  04/12/21 200 lb (90.7 kg)  04/02/18 190 lb (86.2 kg)    BP 120/80   Pulse 76   Ht 4\' 11"  (1.499 m)   Wt 210 lb (95.3 kg)   BMI 42.41 kg/m   Assessment and Plan:  1. Establishing care with new doctor, encounter for Patient establishing care with new physician.  Patient's previous encounters were reviewed as well as most recent labs most recent imaging in Care Everywhere.  2. Amenorrhea New onset over the past 8 months patient has had only 2 periods that have been normal  in duration and amount.  There is multiple reasons that patient may not be having periods on a regular basis including her recent weight gain.  We will check for thyroid concerns along with renal function panel and CBC to see if there is an underlying concern and referred to OB/GYN for Pap smears and evaluation of amenorrhea. - Ambulatory referral to Obstetrics / Gynecology  3. Weight gain Patient's had a significant weight gain over the past 3 to 4 years which she has attributed to COVID and other concerns.  We will begin with looking at her thyroid function to confirm that she is not hypothyroid. - Thyroid Panel With TSH - Renal Function Panel - CBC with  Differential/Platelet  4. Fatigue, unspecified type As noted above patient also has had some fatigue in retrospect this may be secondary to concerns of depression and also patient relates that Once upon a time that she was ADHD. - Renal Function Panel - CBC with Differential/Platelet  5. Need for immunization against influenza Discussed and administered. - Flu Vaccine QUAD 18mo+IM (Fluarix, Fluzone & Alfiuria Quad PF)

## 2021-05-24 NOTE — Patient Instructions (Addendum)
Secondary Amenorrhea Secondary amenorrhea occurs when a female who was previously having menstrual periods has not had them for 3-6 months. A menstrual period is the monthly shedding of the lining of the uterus. The lining of the uterus is made up of blood, tissue, fluid, and mucus. The flow of blood usually occurs during 3-7 consecutive days each month. This condition has many causes. In many cases, treating the underlying cause will return menstrual periods back to a normal cycle. What are the causes? The most common cause of this condition is pregnancy. Other medical conditions that can cause secondary amenorrhea include: Cirrhosis of the liver. Conditions of the blood. Diabetes. Epilepsy. Chronic kidney disease. Polycystic ovary disease. A hormonal imbalance. Ovarian failure. Cystic fibrosis. Early menopause. Cushing syndrome. Thyroid problems. Other causes may include: Malnutrition. Stress or anxiety. Medicines. Extreme obesity. Low body weight or drastic weight loss. Removal of the ovaries or uterus. Contraceptive pills, patches, or vaginal rings. What increases the risk? You are more likely to develop this condition if: You have a family history of this condition. You have an eating disorder. You do extreme athletic training. You have a chronic disease. You abuse substances such as alcohol or cigarettes. What are the signs or symptoms? The main symptom of this condition is a lack of menstrual periods for 3-6 months in a female who previously had menstrual periods. How is this diagnosed? This condition may be diagnosed based on: Your medical history. A physical exam. A pelvic exam to check for problems with your reproductive organs. A procedure to examine the uterus. A measurement of your body mass index (BMI). You may also have other tests, including: Blood tests that measure certain hormones in your body and rule out pregnancy. Urine tests. Imaging tests, such as  an ultrasound, CT scan, or MRI. How is this treated? Treatment for this condition depends on the cause of the amenorrhea. It may involve: Correcting diet-related problems. Treating underlying conditions. Medicines. Lifestyle changes. Surgery. If the condition cannot be corrected, it is sometimes possible to start menstrual periods with medicines. Follow these instructions at home: Lifestyle   Maintain a healthy diet. In general, a healthy diet includes lots of fruits and vegetables, low-fat dairy products, lean meats, and foods that contain fiber. Ask to meet with a registered dietitian for nutrition counseling and meal planning. Maintain a healthy weight. Talk to your health care provider before trying any new diet or exercise plan. Exercise at least 30 minutes 5 or more days each week. Exercising includes brisk walking, yard work, biking, running, swimming, and team sports like basketball and soccer. Ask your health care provider which exercises are safe for you. Get enough sleep. Plan your sleep time to allow for 7-9 hours of sleep each night. Learn to manage stress. Explore relaxation techniques such as meditation, journaling, yoga, or tai chi. General instructions Be aware of changes in your menstrual cycle. Keep a record of when you have your menstrual period. Note the date your period starts, how long it lasts, and any problems you experience. Take over-the-counter and prescription medicines only as told by your health care provider. Keep all follow-up visits. This is important. Contact a health care provider if: Your periods do not return to normal after treatment. Summary Secondary amenorrhea is when a female who was previously having menstrual periods has not gotten her period for 3-6 months. This condition has many causes. In many cases, treating the underlying cause will return menstrual periods back to a normal cycle. Talk  to your health care provider if your periods do not  return to normal after treatment. This information is not intended to replace advice given to you by your health care provider. Make sure you discuss any questions you have with your health care provider. Document Revised: 04/08/2020 Document Reviewed: 04/08/2020 Elsevier Patient Education  2022 [employer] Calories are units of energy. Your body needs a certain number of calories from food to keep going throughout the day. When you eat or drink more calories than your body needs, your body stores the extra calories mostly as fat. When you eat or drink fewer calories than your body needs, your body burns fat to get the energy it needs. Calorie counting means keeping track of how many calories you eat and drink each day. Calorie counting can be helpful if you need to lose weight. If you eat fewer calories than your body needs, you should lose weight. Ask your health care provider what a healthy weight is for you. For calorie counting to work, you will need to eat the right number of calories each day to lose a healthy amount of weight per week. A dietitian can help you figure out how many calories you need in a day and will suggest ways to reach your calorie goal. A healthy amount of weight to lose each week is usually 1-2 lb (0.5-0.9 kg). This usually means that your daily calorie intake should be reduced by 500-750 calories. Eating 1,200-1,500 calories a day can help most women lose weight. Eating 1,500-1,800 calories a day can help most men lose weight. What do I need to know about calorie counting? Work with your health care provider or dietitian to determine how many calories you should get each day. To meet your daily calorie goal, you will need to: Find out how many calories are in each food that you would like to eat. Try to do this before you eat. Decide how much of the food you plan to eat. Keep a food log. Do this by writing down what you ate and how many  calories it had. To successfully lose weight, it is important to balance calorie counting with a healthy lifestyle that includes regular activity. Where do I find calorie information? The number of calories in a food can be found on a Nutrition Facts label. If a food does not have a Nutrition Facts label, try to look up the calories online or ask your dietitian for help. Remember that calories are listed per serving. If you choose to have more than one serving of a food, you will have to multiply the calories per serving by the number of servings you plan to eat. For example, the label on a package of bread might say that a serving size is 1 slice and that there are 90 calories in a serving. If you eat 1 slice, you will have eaten 90 calories. If you eat 2 slices, you will have eaten 180 calories. How do I keep a food log? After each time that you eat, record the following in your food log as soon as possible: What you ate. Be sure to include toppings, sauces, and other extras on the food. How much you ate. This can be measured in cups, ounces, or number of items. How many calories were in each food and drink. The total number of calories in the food you ate. Keep your food log near you, such as in a pocket-sized notebook or  on an app or website on your mobile phone. Some programs will calculate calories for you and show you how many calories you have left to meet your daily goal. What are some portion-control tips? Know how many calories are in a serving. This will help you know how many servings you can have of a certain food. Use a measuring cup to measure serving sizes. You could also try weighing out portions on a kitchen scale. With time, you will be able to estimate serving sizes for some foods. Take time to put servings of different foods on your favorite plates or in your favorite bowls and cups so you know what a serving looks like. Try not to eat straight from a food's packaging, such as  from a bag or box. Eating straight from the package makes it hard to see how much you are eating and can lead to overeating. Put the amount you would like to eat in a cup or on a plate to make sure you are eating the right portion. Use smaller plates, glasses, and bowls for smaller portions and to prevent overeating. Try not to multitask. For example, avoid watching TV or using your computer while eating. If it is time to eat, sit down at a table and enjoy your food. This will help you recognize when you are full. It will also help you be more mindful of what and how much you are eating. What are tips for following this plan? Reading food labels Check the calorie count compared with the serving size. The serving size may be smaller than what you are used to eating. Check the source of the calories. Try to choose foods that are high in protein, fiber, and vitamins, and low in saturated fat, trans fat, and sodium. Shopping Read nutrition labels while you shop. This will help you make healthy decisions about which foods to buy. Pay attention to nutrition labels for low-fat or fat-free foods. These foods sometimes have the same number of calories or more calories than the full-fat versions. They also often have added sugar, starch, or salt to make up for flavor that was removed with the fat. Make a grocery list of lower-calorie foods and stick to it. Cooking Try to cook your favorite foods in a healthier way. For example, try baking instead of frying. Use low-fat dairy products. Meal planning Use more fruits and vegetables. One-half of your plate should be fruits and vegetables. Include lean proteins, such as chicken, [place], and fish. Lifestyle Each week, aim to do one of the following: 150 minutes of moderate exercise, such as walking. 75 minutes of vigorous exercise, such as running. General information Know how many calories are in the foods you eat most often. This will help you calculate  calorie counts faster. Find a way of tracking calories that works for you. Get creative. Try different apps or programs if writing down calories does not work for you. What foods should I eat?  Eat nutritious foods. It is better to have a nutritious, high-calorie food, such as an avocado, than a food with few nutrients, such as a bag of potato chips. Use your calories on foods and drinks that will fill you up and will not leave you hungry soon after eating. Examples of foods that fill you up are nuts and nut butters, vegetables, lean proteins, and high-fiber foods such as whole grains. High-fiber foods are foods with more than 5 g of fiber per serving. Pay attention to calories in drinks.  Low-calorie drinks include water and unsweetened drinks. The items listed above may not be a complete list of foods and beverages you can eat. Contact a dietitian for more information. What foods should I limit? Limit foods or drinks that are not good sources of vitamins, minerals, or protein or that are high in unhealthy fats. These include: Candy. Other sweets. Sodas, specialty coffee drinks, alcohol, and juice. The items listed above may not be a complete list of foods and beverages you should avoid. Contact a dietitian for more information. How do I count calories when eating out? Pay attention to portions. Often, portions are much larger when eating out. Try these tips to keep portions smaller: Consider sharing a meal instead of getting your own. If you get your own meal, eat only half of it. Before you start eating, ask for a container and put half of your meal into it. When available, consider ordering smaller portions from the menu instead of full portions. Pay attention to your food and drink choices. Knowing the way food is cooked and what is included with the meal can help you eat fewer calories. If calories are listed on the menu, choose the lower-calorie options. Choose dishes that include  vegetables, fruits, whole grains, low-fat dairy products, and lean proteins. Choose items that are boiled, broiled, grilled, or steamed. Avoid items that are buttered, battered, fried, or served with cream sauce. Items labeled as crispy are usually fried, unless stated otherwise. Choose water, low-fat milk, unsweetened iced tea, or other drinks without added sugar. If you want an alcoholic beverage, choose a lower-calorie option, such as a glass of wine or light beer. Ask for dressings, sauces, and syrups on the side. These are usually high in calories, so you should limit the amount you eat. If you want a salad, choose a garden salad and ask for grilled meats. Avoid extra toppings such as bacon, cheese, or fried items. Ask for the dressing on the side, or ask for olive oil and vinegar or lemon to use as dressing. Estimate how many servings of a food you are given. Knowing serving sizes will help you be aware of how much food you are eating at restaurants. Where to find more information Centers for Disease Control and Prevention: http://www.wolf.info/ U.[place]. Department of Agriculture: http://www.wilson-mendoza.org/ Summary Calorie counting means keeping track of how many calories you eat and drink each day. If you eat fewer calories than your body needs, you should lose weight. A healthy amount of weight to lose per week is usually 1-2 lb (0.5-0.9 kg). This usually means reducing your daily calorie intake by 500-750 calories. The number of calories in a food can be found on a Nutrition Facts label. If a food does not have a Nutrition Facts label, try to look up the calories online or ask your dietitian for help. Use smaller plates, glasses, and bowls for smaller portions and to prevent overeating. Use your calories on foods and drinks that will fill you up and not leave you hungry shortly after a meal. This information is not intended to replace advice given to you by your health care provider. Make sure you discuss any questions  you have with your health care provider. Document Revised: 10/03/2019 Document Reviewed: 10/03/2019 Elsevier Patient Education  2022 [employer].

## 2021-05-25 LAB — CBC WITH DIFFERENTIAL/PLATELET
Basophils Absolute: 0 10*3/uL (ref 0.0–0.2)
Basos: 1 %
EOS (ABSOLUTE): 0.2 10*3/uL (ref 0.0–0.4)
Eos: 3 %
Hematocrit: 40.3 % (ref 34.0–46.6)
Hemoglobin: 13 g/dL (ref 11.1–15.9)
Immature Grans (Abs): 0 10*3/uL (ref 0.0–0.1)
Immature Granulocytes: 0 %
Lymphocytes Absolute: 2 10*3/uL (ref 0.7–3.1)
Lymphs: 27 %
MCH: 28.3 pg (ref 26.6–33.0)
MCHC: 32.3 g/dL (ref 31.5–35.7)
MCV: 88 fL (ref 79–97)
Monocytes Absolute: 0.5 10*3/uL (ref 0.1–0.9)
Monocytes: 7 %
Neutrophils Absolute: 4.6 10*3/uL (ref 1.4–7.0)
Neutrophils: 62 %
Platelets: 310 10*3/uL (ref 150–450)
RBC: 4.6 x10E6/uL (ref 3.77–5.28)
RDW: 12.9 % (ref 11.7–15.4)
WBC: 7.4 10*3/uL (ref 3.4–10.8)

## 2021-05-25 LAB — RENAL FUNCTION PANEL
Albumin: 4.7 g/dL (ref 3.8–4.8)
BUN/Creatinine Ratio: 22 (ref 9–23)
BUN: 21 mg/dL (ref 6–24)
CO2: 22 mmol/L (ref 20–29)
Calcium: 9.1 mg/dL (ref 8.7–10.2)
Chloride: 102 mmol/L (ref 96–106)
Creatinine, Ser: 0.97 mg/dL (ref 0.57–1.00)
Glucose: 91 mg/dL (ref 65–99)
Phosphorus: 3.5 mg/dL (ref 3.0–4.3)
Potassium: 4.6 mmol/L (ref 3.5–5.2)
Sodium: 137 mmol/L (ref 134–144)
eGFR: 74 mL/min/{1.73_m2} (ref >59)

## 2021-05-25 LAB — THYROID PANEL WITH TSH
Free Thyroxine Index: 1.9 (ref 1.2–4.9)
T3 Uptake Ratio: 27 % (ref 24–39)
T4, Total: 7.2 ug/dL (ref 4.5–12.0)
TSH: 2.05 u[IU]/mL (ref 0.450–4.500)

## 2021-06-15 ENCOUNTER — Other Ambulatory Visit: Payer: Self-pay

## 2021-06-15 ENCOUNTER — Encounter: Payer: Self-pay | Admitting: Family Medicine

## 2021-06-15 ENCOUNTER — Ambulatory Visit: Payer: BC Managed Care – PPO | Admitting: Family Medicine

## 2021-06-15 VITALS — BP 110/88 | HR 74 | Ht 59.0 in | Wt 206.0 lb

## 2021-06-15 DIAGNOSIS — F419 Anxiety disorder, unspecified: Secondary | ICD-10-CM | POA: Diagnosis not present

## 2021-06-15 DIAGNOSIS — Z8659 Personal history of other mental and behavioral disorders: Secondary | ICD-10-CM

## 2021-06-15 NOTE — Progress Notes (Signed)
Date:  06/15/2021   Name:  Stacy Ross   DOB:  [birth year]   MRN:  [id number]   Chief Complaint: Anxiety (Wants referral for ADHD and anxiety)  Anxiety Presents for follow-up visit. Symptoms include decreased concentration, excessive worry, insomnia, irritability, muscle tension, nervous/anxious behavior and restlessness. Patient reports no chest pain, compulsions, confusion, depressed mood, dizziness, dry mouth, feeling of choking, hyperventilation, impotence, malaise, nausea, obsessions, palpitations, panic, shortness of breath or suicidal ideas. Symptoms occur constantly. The severity of symptoms is interfering with daily activities. The patient sleeps 5 hours per night. The quality of sleep is poor. Nighttime awakenings: several.     Lab Results  Component Value Date   CREATININE 0.97 05/24/2021   BUN 21 05/24/2021   NA 137 05/24/2021   K 4.6 05/24/2021   CL 102 05/24/2021   CO2 22 05/24/2021   No results found for: CHOL, HDL, LDLCALC, LDLDIRECT, TRIG, CHOLHDL Lab Results  Component Value Date   TSH 2.050 05/24/2021   No results found for: HGBA1C Lab Results  Component Value Date   WBC 7.4 05/24/2021   HGB 13.0 05/24/2021   HCT 40.3 05/24/2021   MCV 88 05/24/2021   PLT 310 05/24/2021   No results found for: ALT, AST, GGT, ALKPHOS, BILITOT   Review of Systems  Constitutional:  Positive for irritability. Negative for chills and fever.  HENT:  Negative for drooling, ear discharge, ear pain and sore throat.   Respiratory:  Negative for cough, shortness of breath and wheezing.   Cardiovascular:  Negative for chest pain, palpitations and leg swelling.  Gastrointestinal:  Negative for abdominal pain, blood in stool, constipation, diarrhea and nausea.  Endocrine: Negative for polydipsia.  Genitourinary:  Negative for dysuria, frequency, hematuria, impotence and urgency.  Musculoskeletal:  Negative for back pain, myalgias and neck pain.  Skin:  Negative for rash.   Allergic/Immunologic: Negative for environmental allergies.  Neurological:  Negative for dizziness and headaches.  Hematological:  Does not bruise/bleed easily.  Psychiatric/Behavioral:  Positive for decreased concentration. Negative for confusion and suicidal ideas. The patient is nervous/anxious and has insomnia.    There are no problems to display for this patient.   No Known Allergies  Past Surgical History:  Procedure Laterality Date   right hand repair N/A     Social History   Tobacco Use   Smoking status: Former    Packs/day: 0.25    Years: 10.00    Pack years: 2.50    Types: Cigarettes    Quit date: 03/05/2021    Years since quitting: 0.2   Smokeless tobacco: Never  Vaping Use   Vaping Use: Never used  Substance Use Topics   Alcohol use: Yes    Comment: social   Drug use: No     Medication list has been reviewed and updated.  No outpatient medications have been marked as taking for the 06/15/21 encounter (Office Visit) with Duanne Limerick, MD.    [facility] 2/9 Scores 06/15/2021 05/24/2021  PHQ - 2 Score 0 0  PHQ- 9 Score 6 0    GAD 7 : Generalized Anxiety Score 06/15/2021 05/24/2021  Nervous, Anxious, on Edge 0 2  Control/stop worrying 1 1  Worry too much - different things 2 2  Trouble relaxing 2 1  Restless 3 1  Easily annoyed or irritable 1 1  Afraid - awful might happen 0 1  Total GAD 7 Score 9 9  Anxiety Difficulty Somewhat difficult Very difficult  BP Readings from Last 3 Encounters:  06/15/21 110/88  05/24/21 120/80  04/12/21 139/89    Physical Exam Vitals and nursing note reviewed.  Constitutional:      General: She is not in acute distress.    Appearance: She is not diaphoretic.  HENT:     Head: Normocephalic and atraumatic.     Right Ear: Tympanic membrane, ear canal and external ear normal.     Left Ear: Tympanic membrane, ear canal and external ear normal.     Nose: Nose normal. No congestion or rhinorrhea.  Eyes:     General:         Right eye: No discharge.        Left eye: No discharge.     Conjunctiva/sclera: Conjunctivae normal.     Pupils: Pupils are equal, round, and reactive to light.  Neck:     Thyroid: No thyromegaly.     Vascular: No carotid bruit or JVD.  Cardiovascular:     Rate and Rhythm: Normal rate and regular rhythm.     Heart sounds: Normal heart sounds. No murmur heard.   No friction rub. No gallop.  Pulmonary:     Effort: Pulmonary effort is normal.     Breath sounds: Normal breath sounds. No wheezing or rhonchi.  Abdominal:     General: Bowel sounds are normal.     Palpations: Abdomen is soft. There is no mass.     Tenderness: There is no abdominal tenderness. There is no guarding.  Musculoskeletal:        General: Normal range of motion.     Cervical back: Normal range of motion and neck supple.  Lymphadenopathy:     Cervical: No cervical adenopathy.  Skin:    General: Skin is warm and dry.  Neurological:     Mental Status: She is alert.     Deep Tendon Reflexes: Reflexes are normal and symmetric.    Wt Readings from Last 3 Encounters:  06/15/21 206 lb (93.4 kg)  05/24/21 210 lb (95.3 kg)  04/12/21 200 lb (90.7 kg)    BP 110/88   Pulse 74   Ht 4\' 11"  (1.499 m)   Wt 206 lb (93.4 kg)   BMI 41.61 kg/m   Assessment and Plan:  1. Anxiety Relatively new onset.  PHQ is 6 Gad score is 9.  We will refer to psychiatry for evaluation and treatment thereof.  Patient would also like to restart on medication which is noted below - Ambulatory referral to Psychiatry  2. History of ADHD Patient has a previous history of ADHD and would like to resume medication.  We will refer to psychiatry for evaluation and determination of treatment thereof. - Ambulatory referral to Psychiatry   Patient is also been encouraged to return to GYN for reevaluation of amenorrhea with possibility of early menopause versus secondary concern.  Patient also is to return for a Pap smear that she is due as  well

## 2021-06-29 ENCOUNTER — Ambulatory Visit: Payer: BC Managed Care – PPO | Admitting: Family Medicine

## 2021-08-02 DIAGNOSIS — N911 Secondary amenorrhea: Secondary | ICD-10-CM | POA: Insufficient documentation

## 2021-08-02 DIAGNOSIS — J301 Allergic rhinitis due to pollen: Secondary | ICD-10-CM | POA: Insufficient documentation

## 2021-08-02 DIAGNOSIS — N920 Excessive and frequent menstruation with regular cycle: Secondary | ICD-10-CM | POA: Insufficient documentation

## 2021-08-02 DIAGNOSIS — L309 Dermatitis, unspecified: Secondary | ICD-10-CM | POA: Insufficient documentation

## 2021-08-02 DIAGNOSIS — F909 Attention-deficit hyperactivity disorder, unspecified type: Secondary | ICD-10-CM | POA: Insufficient documentation

## 2021-08-02 DIAGNOSIS — R5382 Chronic fatigue, unspecified: Secondary | ICD-10-CM | POA: Insufficient documentation

## 2021-08-02 DIAGNOSIS — E559 Vitamin D deficiency, unspecified: Secondary | ICD-10-CM | POA: Insufficient documentation

## 2021-08-04 ENCOUNTER — Other Ambulatory Visit: Payer: Self-pay

## 2021-08-04 ENCOUNTER — Encounter: Payer: Self-pay | Admitting: Emergency Medicine

## 2021-08-04 ENCOUNTER — Ambulatory Visit
Admission: EM | Admit: 2021-08-04 | Discharge: 2021-08-04 | Disposition: A | Discharge status: Home / Self Care | Payer: BC Managed Care – PPO | Attending: Physician Assistant | Admitting: Physician Assistant

## 2021-08-04 ENCOUNTER — Ambulatory Visit (INDEPENDENT_AMBULATORY_CARE_PROVIDER_SITE_OTHER): Payer: BC Managed Care – PPO

## 2021-08-04 DIAGNOSIS — R059 Cough, unspecified: Secondary | ICD-10-CM | POA: Diagnosis not present

## 2021-08-04 DIAGNOSIS — Z20822 Contact with and (suspected) exposure to covid-19: Secondary | ICD-10-CM | POA: Diagnosis not present

## 2021-08-04 DIAGNOSIS — R051 Acute cough: Secondary | ICD-10-CM | POA: Insufficient documentation

## 2021-08-04 DIAGNOSIS — B001 Herpesviral vesicular dermatitis: Secondary | ICD-10-CM | POA: Insufficient documentation

## 2021-08-04 DIAGNOSIS — R0989 Other specified symptoms and signs involving the circulatory and respiratory systems: Secondary | ICD-10-CM | POA: Diagnosis not present

## 2021-08-04 DIAGNOSIS — J45909 Unspecified asthma, uncomplicated: Secondary | ICD-10-CM | POA: Insufficient documentation

## 2021-08-04 DIAGNOSIS — J209 Acute bronchitis, unspecified: Secondary | ICD-10-CM | POA: Diagnosis not present

## 2021-08-04 DIAGNOSIS — Z87891 Personal history of nicotine dependence: Secondary | ICD-10-CM | POA: Insufficient documentation

## 2021-08-04 MED ORDER — PSEUDOEPH-BROMPHEN-DM 30-2-10 MG/5ML PO SYRP: 10.0000 mL | ORAL_SOLUTION | Freq: Four times a day (QID) | ORAL | 0 refills | Status: AC | PRN

## 2021-08-04 MED ORDER — ALBUTEROL SULFATE HFA 108 (90 BASE) MCG/ACT IN AERS: 1.0000 | INHALATION_SPRAY | Freq: Four times a day (QID) | RESPIRATORY_TRACT | 0 refills | Status: DC | PRN

## 2021-08-04 MED ORDER — VALACYCLOVIR HCL 1 G PO TABS: 2000.0000 mg | ORAL_TABLET | Freq: Two times a day (BID) | ORAL | 0 refills | Status: AC

## 2021-08-04 MED ORDER — PREDNISONE 10 MG PO TABS: ORAL_TABLET | ORAL | 0 refills | Status: DC

## 2021-08-04 NOTE — ED Provider Notes (Signed)
[facility]    CSN: [id number] Arrival date & time: 08/04/21  1006      History   Chief Complaint Chief Complaint  Patient presents with   Cough    HPI Stacy Ross is a 45 y.o. female presenting for 1 month history of cough that is occasionally productive.  Also reports over the past week she is felt a little short of breath and had wheezing.  Denies any fevers throughout the course of the illness.  Patient admits to fatigue over the past week as well.  Patient reports that about 3 days ago she developed runny nose.  Reports concern for COVID and would like to be tested.  She says she has tested herself multiple times throughout her illness and has always been negative for COVID.  She reports never having a fever.  No known exposure to COVID.  Has tried multiple over-the-counter cough medications without any improvement in her symptoms.  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  Has been taking her regular allergy medication as well without improvement in symptoms.  Denies any contact with any known allergens.  No other complaints.  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      Home Medications    Prior to Admission medications   Medication Sig Start Date End Date Taking? Authorizing Provider  albuterol (VENTOLIN HFA) 108 (90 Base) MCG/ACT inhaler Inhale 1-2 puffs into the lungs every 6 (six) hours as needed for wheezing or shortness of breath. 08/04/21  Yes  Eusebio Friendly B, PA-C  brompheniramine-pseudoephedrine-DM 30-2-10 MG/5ML syrup Take 10 mLs by mouth 4 (four) times daily as needed for up to 7 days. 08/04/21 08/11/21 Yes Shirlee Latch, PA-C  predniSONE (DELTASONE) 10 MG tablet Take 6 tabs p.o. on day 1 and decrease by 1 tablet daily until complete 08/04/21  Yes Shirlee Latch, PA-C  valACYclovir (VALTREX) 1000 MG tablet Take 2 tablets (2,000 mg total) by mouth 2 (two) times daily for 1 day. 08/04/21 08/05/21 Yes Shirlee Latch, PA-C    Family History Family History  Problem Relation Age of Onset   Stroke Mother    Hypertension Father    Heart disease Maternal Grandfather    Diabetes Maternal Grandfather    Cancer Paternal Grandmother    Cancer Paternal Grandfather     Social History Social History   Tobacco Use   Smoking status: Former    Packs/day: 0.25    Years: 10.00    Pack years: 2.50    Types: Cigarettes    Quit date: 03/05/2021    Years since quitting: 0.4   Smokeless tobacco: Never  Vaping Use   Vaping Use: Never used  Substance Use Topics   Alcohol use: Yes    Comment: social   Drug use: No     Allergies   Patient has no known allergies.   Review of Systems Review  of Systems  Constitutional:  Positive for fatigue. Negative for chills, diaphoresis and fever.  HENT:  Positive for congestion and rhinorrhea. Negative for ear pain, sinus pressure, sinus pain and sore throat.   Respiratory:  Positive for cough, shortness of breath and wheezing.   Gastrointestinal:  Negative for abdominal pain, nausea and vomiting.  Musculoskeletal:  Negative for arthralgias and myalgias.  Skin:  Negative for rash.  Neurological:  Negative for weakness and headaches.  Hematological:  Negative for adenopathy.  Psychiatric/Behavioral:  Positive for sleep disturbance.     Physical Exam Triage Vital Signs ED Triage Vitals  Enc Vitals Group     BP 08/04/21 1149 137/88     Pulse Rate 08/04/21 1149 95     Resp --      Temp  08/04/21 1149 99.2 F (37.3 C)     Temp Source 08/04/21 1149 Oral     SpO2 08/04/21 1149 100 %     Weight --      Height --      Head Circumference --      Peak Flow --      Pain Score 08/04/21 1144 0     Pain Loc --      Pain Edu? --      Excl. in GC? --    No data found.  Updated Vital Signs BP 137/88 (BP Location: Right Arm)   Pulse 95   Temp 99.2 F (37.3 C) (Oral)   LMP 12/04/2020   SpO2 100%       Physical Exam Vitals and nursing note reviewed.  Constitutional:      General: She is not in acute distress.    Appearance: Normal appearance. She is not ill-appearing or toxic-appearing.  HENT:     Head: Normocephalic and atraumatic.     Nose: Congestion and rhinorrhea (trace clear drainage) present.     Mouth/Throat:     Mouth: Mucous membranes are moist.     Pharynx: Oropharynx is clear.     Comments: 1 cold sore upper mid lip Eyes:     General: No scleral icterus.       Right eye: No discharge.        Left eye: No discharge.     Conjunctiva/sclera: Conjunctivae normal.  Cardiovascular:     Rate and Rhythm: Normal rate and regular rhythm.     Heart sounds: Normal heart sounds.  Pulmonary:     Effort: Pulmonary effort is normal. No respiratory distress.     Breath sounds: Wheezing (few scattered wheezes throughout) present.  Musculoskeletal:     Cervical back: Neck supple.  Skin:    General: Skin is dry.  Neurological:     General: No focal deficit present.     Mental Status: She is alert. Mental status is at baseline.     Motor: No weakness.     Gait: Gait normal.  Psychiatric:        Mood and Affect: Mood normal.        Behavior: Behavior normal.        Thought Content: Thought content normal.     UC Treatments / Results  Labs (all labs ordered are listed, but only abnormal results are displayed) Labs Reviewed  SARS CORONAVIRUS 2 (TAT 6-24 HRS)    EKG   Radiology DG Chest 2 View  Result Date: 08/04/2021 CLINICAL DATA:  Cough EXAM: CHEST  - 2 VIEW COMPARISON:  None. FINDINGS: The heart size and mediastinal contours are  within normal limits. Both lungs are clear. The visualized skeletal structures are unremarkable. IMPRESSION: No acute abnormality of the lungs. Electronically Signed   By: Jearld Lesch M.D.   On: 08/04/2021 12:20    Procedures Procedures (including critical care time)  Medications Ordered in UC Medications - No data to display  Initial Impression / Assessment and Plan / UC Course  I have reviewed the triage vital signs and the nursing notes.  Pertinent labs & imaging results that were available during my care of the patient were reviewed by me and considered in my medical decision making (see chart for details).   45 year old female presenting for 1 month history of cough.  Over the past week she has had increased fatigue, wheezing and shortness of breath.  Over the past 3 days she has had a runny nose.  Vitals are stable and she is overall well-appearing.  She does have nasal congestion and clear rhinorrhea on exam.  Additionally she has diffuse mild wheezing throughout chest.  Chest x-ray performed today shows no evidence of pneumonia.  PCR COVID test obtained.  Current CDC guidelines, isolation protocol and ED precautions reviewed if COVID-positive.  If COVID-positive, advised to isolate from 3 days ago since the nasal congestion is new.  Suspect patient has viral bronchitis.  Advise increasing rest and fluids.  Sent prednisone.  Suspect that she may be having asthma exacerbation as well.  Additionally sent albuterol inhaler and Bromfed-DM.  Patient has a cold sore of her upper lip for the past 3 days have sent Valtrex.  Supportive care encouraged.  Reviewed return and ED precautions.   Final Clinical Impressions(s) / UC Diagnoses   Final diagnoses:  Acute bronchitis, unspecified organism  Acute cough  Runny nose  Cold sore     Discharge Instructions      You have received COVID testing today  either for positive exposure, concerning symptoms that could be related to COVID infection, screening purposes, or re-testing after confirmed positive.  Your test obtained today checks for active viral infection in the last 1-2 weeks. If your test is negative now, you can still test positive later. So, if you do develop symptoms you should either get re-tested and/or isolate x 5 days and then strict mask use x 5 days (unvaccinated) or mask use x 10 days (vaccinated). Please follow CDC guidelines.  While Rapid antigen tests come back in 15-20 minutes, send out PCR/molecular test results typically come back within 1-3 days. In the mean time, if you are symptomatic, assume this could be a positive test and treat/monitor yourself as if you do have COVID.   We will call with test results if positive. Please download the MyChart app and set up a profile to access test results.   If symptomatic, go home and rest. Push fluids. Take Tylenol as needed for discomfort. Gargle warm salt water. Throat lozenges. Take Mucinex DM or Robitussin for cough. Humidifier in bedroom to ease coughing. Warm showers. Also review the COVID handout for more information.  COVID-19 INFECTION: The incubation period of COVID-19 is approximately 14 days after exposure, with most symptoms developing in roughly 4-5 days. Symptoms may range in severity from mild to critically severe. Roughly 80% of those infected will have mild symptoms. People of any age may become infected with COVID-19 and have the ability to transmit the virus. The most common symptoms include: fever, fatigue, cough, body aches, headaches, sore throat, nasal congestion, shortness of breath, nausea, vomiting, diarrhea, changes in smell  and/or taste.    COURSE OF ILLNESS Some patients may begin with mild disease which can progress quickly into critical symptoms. If your symptoms are worsening please call ahead to the Emergency Department and proceed there for further  treatment. Recovery time appears to be roughly 1-2 weeks for mild symptoms and 3-6 weeks for severe disease.   GO IMMEDIATELY TO ER FOR FEVER YOU ARE UNABLE TO GET DOWN WITH TYLENOL, BREATHING PROBLEMS, CHEST PAIN, FATIGUE, LETHARGY, INABILITY TO EAT OR DRINK, ETC  QUARANTINE AND ISOLATION: To help decrease the spread of COVID-19 please remain isolated if you have COVID infection or are highly suspected to have COVID infection. This means -stay home and isolate to one room in the home if you live with others. Do not share a bed or bathroom with others while ill, sanitize and wipe down all countertops and keep common areas clean and disinfected. Stay home for 5 days. If you have no symptoms or your symptoms are resolving after 5 days, you can leave your house. Continue to wear a mask around others for 5 additional days. If you have been in close contact (within 6 feet) of someone diagnosed with COVID 19, you are advised to quarantine in your home for 14 days as symptoms can develop anywhere from 2-14 days after exposure to the virus. If you develop symptoms, you  must isolate.  Most current guidelines for COVID after exposure -unvaccinated: isolate 5 days and strict mask use x 5 days. Test on day 5 is possible -vaccinated: wear mask x 10 days if symptoms do not develop -You do not necessarily need to be tested for COVID if you have + exposure and  develop symptoms. Just isolate at home x10 days from symptom onset During this global pandemic, CDC advises to practice social distancing, try to stay at least 49ft away from others at all times. Wear a face covering. Wash and sanitize your hands regularly and avoid going anywhere that is not necessary.  KEEP IN MIND THAT THE COVID TEST IS NOT 100% ACCURATE AND YOU SHOULD STILL DO EVERYTHING TO PREVENT POTENTIAL SPREAD OF VIRUS TO OTHERS (WEAR MASK, WEAR GLOVES, WASH HANDS AND SANITIZE REGULARLY). IF INITIAL TEST IS NEGATIVE, THIS MAY NOT MEAN YOU ARE DEFINITELY  NEGATIVE. MOST ACCURATE TESTING IS DONE 5-7 DAYS AFTER EXPOSURE.   It is not advised by CDC to get re-tested after receiving a positive COVID test since you can still test positive for weeks to months after you have already cleared the virus.   *If you have not been vaccinated for COVID, I strongly suggest you consider getting vaccinated as long as there are no contraindications.       ED Prescriptions     Medication Sig Dispense Auth. Provider   brompheniramine-pseudoephedrine-DM 30-2-10 MG/5ML syrup Take 10 mLs by mouth 4 (four) times daily as needed for up to 7 days. 150 mL Eusebio Friendly B, PA-C   predniSONE (DELTASONE) 10 MG tablet Take 6 tabs p.o. on day 1 and decrease by 1 tablet daily until complete 21 tablet Eusebio Friendly B, PA-C   valACYclovir (VALTREX) 1000 MG tablet Take 2 tablets (2,000 mg total) by mouth 2 (two) times daily for 1 day. 4 tablet Eusebio Friendly B, PA-C   albuterol (VENTOLIN HFA) 108 (90 Base) MCG/ACT inhaler Inhale 1-2 puffs into the lungs every 6 (six) hours as needed for wheezing or shortness of breath. 1 g Shirlee Latch, PA-C      I have reviewed the  PDMP during this encounter.   Shirlee Latch, PA-C 08/04/21 1259

## 2021-08-04 NOTE — ED Notes (Signed)
Called phone number given/no answer/mailbox not set up

## 2021-08-04 NOTE — Discharge Instructions (Addendum)

## 2021-08-04 NOTE — ED Triage Notes (Signed)
Cough for a month.  Patient reports inability to get rid of cough.  Patient has noticed a change in breathing over the past week.  Yesterday started feeling bad again, nose started running.

## 2021-08-05 LAB — SARS CORONAVIRUS 2 (TAT 6-24 HRS): SARS Coronavirus 2: NEGATIVE

## 2021-09-21 ENCOUNTER — Ambulatory Visit: Payer: BC Managed Care – PPO | Admitting: Family Medicine

## 2021-09-21 ENCOUNTER — Encounter: Payer: Self-pay | Admitting: Family Medicine

## 2021-09-21 ENCOUNTER — Telehealth: Payer: Self-pay

## 2021-09-21 ENCOUNTER — Other Ambulatory Visit: Payer: Self-pay

## 2021-09-21 VITALS — BP 120/84 | HR 72 | Ht 59.0 in | Wt 213.0 lb

## 2021-09-21 DIAGNOSIS — R053 Chronic cough: Secondary | ICD-10-CM

## 2021-09-21 MED ORDER — ALBUTEROL SULFATE HFA 108 (90 BASE) MCG/ACT IN AERS: 1.0000 | INHALATION_SPRAY | Freq: Four times a day (QID) | RESPIRATORY_TRACT | 0 refills | Status: AC | PRN

## 2021-09-21 MED ORDER — PROMETHAZINE-DM 6.25-15 MG/5ML PO SYRP
5.0000 mL | ORAL_SOLUTION | Freq: Four times a day (QID) | ORAL | 0 refills | Status: DC | PRN
Start: 2021-09-21 — End: 2021-11-22

## 2021-09-21 NOTE — Telephone Encounter (Signed)
Pt has appt on Friday the 20th of January @ 11:00 with [facility]

## 2021-09-21 NOTE — Progress Notes (Signed)
Date:  09/21/2021   Name:  Stacy Ross   DOB:  20-Apr-1976   MRN:  [id number]   Chief Complaint: Cough Martin Majestic to doctor before Christmas- had bronchitis. Has a cough that gets worse at night. Thick/ clear production)  Cough This is a new problem. The current episode started more than 1 month ago (mos ago). The problem has been waxing and waning. The problem occurs every few minutes. The cough is Non-productive. Associated symptoms include postnasal drip, rhinorrhea, shortness of breath and wheezing. Pertinent negatives include no chest pain, chills, ear congestion, ear pain, fever, headaches, heartburn, hemoptysis, myalgias, nasal congestion, rash or sore throat. The symptoms are aggravated by pollens (not allergy tested). Risk factors for lung disease include smoking/tobacco exposure. She has tried a beta-agonist inhaler and oral steroids (oral prednisone) for the symptoms. The treatment provided moderate relief. Her past medical history is significant for asthma and COPD. There is no history of environmental allergies.   Lab Results  Component Value Date   NA 137 05/24/2021   K 4.6 05/24/2021   CO2 22 05/24/2021   GLUCOSE 91 05/24/2021   BUN 21 05/24/2021   CREATININE 0.97 05/24/2021   CALCIUM 9.1 05/24/2021   EGFR 74 05/24/2021   No results found for: CHOL, HDL, LDLCALC, LDLDIRECT, TRIG, CHOLHDL Lab Results  Component Value Date   TSH 2.050 05/24/2021   No results found for: HGBA1C Lab Results  Component Value Date   WBC 7.4 05/24/2021   HGB 13.0 05/24/2021   HCT 40.3 05/24/2021   MCV 88 05/24/2021   PLT 310 05/24/2021   No results found for: ALT, AST, GGT, ALKPHOS, BILITOT No results found for: 25OHVITD2, 25OHVITD3, VD25OH   Review of Systems  Constitutional:  Negative for chills and fever.  HENT:  Positive for postnasal drip and rhinorrhea. Negative for drooling, ear discharge, ear pain and sore throat.   Respiratory:  Positive for cough, shortness of breath, wheezing and  stridor. Negative for hemoptysis, choking and chest tightness.   Cardiovascular:  Negative for chest pain, palpitations and leg swelling.  Gastrointestinal:  Negative for abdominal pain, blood in stool, constipation, diarrhea, heartburn and nausea.       Dysphagia  Endocrine: Negative for polydipsia.  Genitourinary:  Negative for dysuria, frequency, hematuria and urgency.  Musculoskeletal:  Negative for back pain, myalgias and neck pain.  Skin:  Negative for rash.  Allergic/Immunologic: Negative for environmental allergies.  Neurological:  Negative for dizziness and headaches.  Hematological:  Does not bruise/bleed easily.  Psychiatric/Behavioral:  Negative for suicidal ideas. The patient is not nervous/anxious.    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  Current Meds  Medication Sig   albuterol (VENTOLIN HFA) 108 (90 Base) MCG/ACT inhaler Inhale 1-2 puffs into the lungs every 6 (six) hours as needed for wheezing or shortness of breath.    PHQ 2/9 Scores 06/15/2021 05/24/2021  PHQ - 2 Score 0 0  PHQ- 9 Score 6 0    GAD 7 : Generalized Anxiety Score 06/15/2021 05/24/2021  Nervous, Anxious, on Edge 0 2  Control/stop worrying 1 1  Worry  too much - different things 2 2  Trouble relaxing 2 1  Restless 3 1  Easily annoyed or irritable 1 1  Afraid - awful might happen 0 1  Total GAD 7 Score 9 9  Anxiety Difficulty Somewhat difficult Very difficult    BP Readings from Last 3 Encounters:  09/21/21 120/84  08/04/21 137/88  06/15/21 110/88    Physical Exam Vitals and nursing note reviewed.  Constitutional:      Appearance: She is well-developed.  HENT:     Head: Normocephalic.     Right Ear: Tympanic membrane, ear canal and external ear normal.     Left Ear: Tympanic membrane, ear canal and external ear normal.     Nose: Nose normal. No congestion or rhinorrhea.  Eyes:     General: Lids are everted, no foreign bodies appreciated. No scleral icterus.       Left eye: No foreign body or hordeolum.     Conjunctiva/sclera: Conjunctivae normal.     Right eye: Right conjunctiva is not injected.     Left eye: Left conjunctiva is not injected.     Pupils: Pupils are equal, round, and reactive to light.  Neck:     Thyroid: No thyromegaly.     Vascular: No JVD.     Trachea: No tracheal deviation.  Cardiovascular:     Rate and Rhythm: Normal rate and regular rhythm.     Heart sounds: Normal heart sounds. No murmur heard.   No friction rub. No gallop.  Pulmonary:     Effort: Pulmonary effort is normal. No respiratory distress.     Breath sounds: Normal breath sounds. No stridor or decreased air movement. No decreased breath sounds, wheezing, rhonchi or rales.     Comments: Increased E/I Chest:     Chest wall: No tenderness.  Abdominal:     General: Bowel sounds are normal.     Palpations: Abdomen is soft. There is no mass.     Tenderness: There is no abdominal tenderness. There is no guarding or rebound.  Musculoskeletal:        General: No tenderness. Normal range of motion.     Cervical back: Normal range of motion and neck supple.  Lymphadenopathy:     Cervical: No cervical adenopathy.  Skin:    General: Skin is  warm.     Findings: No rash.  Neurological:     Mental Status: She is alert and oriented to person, place, and time.     Cranial Nerves: No cranial nerve deficit.     Deep Tendon Reflexes: Reflexes normal.  Psychiatric:        Mood and Affect: Mood is not anxious or depressed.    Wt Readings from Last 3 Encounters:  09/21/21 213 lb (96.6 kg)  06/15/21 206 lb (93.4 kg)  05/24/21 210 lb (95.3 kg)    BP 120/84    Pulse 72    Ht '4\' 11"'  (1.499 m)    Wt  213 lb (96.6 kg)    BMI 43.02 kg/m   Assessment and Plan:  1. Chronic cough Chronic.  Persistent.  Relatively stable.  Patient has developed a cough for the past several months that may be secondary to multiple things including she has a history of reactive airway disease, history of COVID infection in April or May 2022, suspected history of multiple allergies, and a history of smoking that she recently quit entirely last year and therefore there may be some COPD concern as well.  Will refer to pulmonary for evaluation and the possibility of pulmonary function testing.  In the meantime we will give a trial of Breztri 1 to 2 puffs twice a day and refill her albuterol inhaler 1 to 2 puffs every 6 hours.  Patient is also being given a cough preparation with dextromethorphan for cough suppression at night. - Ambulatory referral to Pulmonology - albuterol (VENTOLIN HFA) 108 (90 Base) MCG/ACT inhaler; Inhale 1-2 puffs into the lungs every 6 (six) hours as needed for wheezing or shortness of breath.  Dispense: 1 g; Refill: 0 - promethazine-dextromethorphan (PROMETHAZINE-DM) 6.25-15 MG/5ML syrup; Take 5 mLs by mouth 4 (four) times daily as needed for cough.  Dispense: 118 mL; Refill: 0   In addition we have called [facility] and set up an appointment for patient while she was here for evaluation of ADHD.

## 2021-09-24 ENCOUNTER — Encounter: Payer: Self-pay | Admitting: Family Medicine

## 2021-10-05 ENCOUNTER — Other Ambulatory Visit: Payer: Self-pay | Admitting: Certified Nurse Midwife

## 2021-10-05 DIAGNOSIS — N6320 Unspecified lump in the left breast, unspecified quadrant: Secondary | ICD-10-CM

## 2021-10-06 ENCOUNTER — Other Ambulatory Visit: Payer: Self-pay | Admitting: *Deleted

## 2021-10-06 ENCOUNTER — Inpatient Hospital Stay
Admission: RE | Admit: 2021-10-06 | Discharge: 2021-10-06 | Disposition: A | Discharge status: Home / Self Care | Payer: Self-pay | Source: Ambulatory Visit | Attending: *Deleted | Admitting: *Deleted

## 2021-10-06 DIAGNOSIS — Z1231 Encounter for screening mammogram for malignant neoplasm of breast: Secondary | ICD-10-CM

## 2021-10-08 LAB — HM PAP SMEAR: HM Pap smear: NORMAL

## 2021-10-08 LAB — RESULTS CONSOLE HPV: CHL HPV: NEGATIVE

## 2021-10-21 ENCOUNTER — Ambulatory Visit
Admission: RE | Admit: 2021-10-21 | Discharge: 2021-10-21 | Disposition: A | Discharge status: Home / Self Care | Payer: BC Managed Care – PPO | Source: Ambulatory Visit | Attending: Certified Nurse Midwife | Admitting: Certified Nurse Midwife

## 2021-10-21 ENCOUNTER — Other Ambulatory Visit: Payer: Self-pay

## 2021-10-21 DIAGNOSIS — N6320 Unspecified lump in the left breast, unspecified quadrant: Secondary | ICD-10-CM | POA: Insufficient documentation

## 2021-11-22 ENCOUNTER — Other Ambulatory Visit: Payer: Self-pay

## 2021-11-22 ENCOUNTER — Ambulatory Visit (INDEPENDENT_AMBULATORY_CARE_PROVIDER_SITE_OTHER): Payer: BC Managed Care – PPO | Admitting: Family Medicine

## 2021-11-22 ENCOUNTER — Encounter: Payer: Self-pay | Admitting: Family Medicine

## 2021-11-22 VITALS — BP 138/80 | HR 72 | Ht 59.0 in | Wt 205.0 lb

## 2021-11-22 DIAGNOSIS — H6501 Acute serous otitis media, right ear: Secondary | ICD-10-CM | POA: Diagnosis not present

## 2021-11-22 DIAGNOSIS — R051 Acute cough: Secondary | ICD-10-CM

## 2021-11-22 DIAGNOSIS — J0100 Acute maxillary sinusitis, unspecified: Secondary | ICD-10-CM

## 2021-11-22 MED ORDER — AZITHROMYCIN 250 MG PO TABS: ORAL_TABLET | ORAL | 0 refills | Status: AC

## 2021-11-22 MED ORDER — GUAIFENESIN-CODEINE 100-10 MG/5ML PO SYRP: 5.0000 mL | ORAL_SOLUTION | Freq: Three times a day (TID) | ORAL | 0 refills | Status: DC | PRN

## 2021-11-22 NOTE — Progress Notes (Signed)
? ? ?Date:  11/22/2021  ? ?Name:  Stacy Ross   DOB:  1976/04/13   MRN:  [id number] ? ? ?Chief Complaint: Sinusitis (Green productive cough has turned to white. Pressure in head, R) ear pain) ? ?Sinusitis ?This is a chronic problem. The current episode started in the past 7 days. The problem has been waxing and waning since onset. There has been no fever. The pain is mild. Associated symptoms include congestion, coughing, ear pain, a hoarse voice, shortness of breath, sinus pressure and a sore throat. Pertinent negatives include no chills, headaches or neck pain. Treatments tried: allegra d. The treatment provided mild relief.  ? ?Lab Results  ?Component Value Date  ? NA 137 05/24/2021  ? K 4.6 05/24/2021  ? CO2 22 05/24/2021  ? GLUCOSE 91 05/24/2021  ? BUN 21 05/24/2021  ? CREATININE 0.97 05/24/2021  ? CALCIUM 9.1 05/24/2021  ? EGFR 74 05/24/2021  ? ?No results found for: CHOL, HDL, LDLCALC, LDLDIRECT, TRIG, CHOLHDL ?Lab Results  ?Component Value Date  ? TSH 2.050 05/24/2021  ? ?No results found for: HGBA1C ?Lab Results  ?Component Value Date  ? WBC 7.4 05/24/2021  ? HGB 13.0 05/24/2021  ? HCT 40.3 05/24/2021  ? MCV 88 05/24/2021  ? PLT 310 05/24/2021  ? ?No results found for: ALT, AST, GGT, ALKPHOS, BILITOT ?No results found for: 25OHVITD2, [place], VD25OH  ? ?Review of Systems  ?Constitutional:  Negative for chills and fever.  ?HENT:  Positive for congestion, ear pain, hoarse voice, sinus pressure and sore throat. Negative for drooling and ear discharge.   ?Respiratory:  Positive for cough and shortness of breath. Negative for wheezing.   ?Cardiovascular:  Negative for chest pain, palpitations and leg swelling.  ?Gastrointestinal:  Negative for abdominal pain, blood in stool, constipation, diarrhea and nausea.  ?Endocrine: Negative for polydipsia.  ?Genitourinary:  Negative for dysuria, frequency, hematuria and urgency.  ?Musculoskeletal:  Negative for back pain, myalgias and neck pain.  ?Skin:  Negative for rash.   ?Allergic/Immunologic: Negative for environmental allergies.  ?Neurological:  Negative for dizziness and headaches.  ?Hematological:  Does not bruise/bleed easily.  ?Psychiatric/Behavioral:  Negative for suicidal ideas. The patient is not nervous/anxious.   ? ?Patient Active Problem List  ? Diagnosis Date Noted  ? Adult attention deficit hyperactivity disorder 08/02/2021  ? Allergic rhinitis due to pollen 08/02/2021  ? 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 Eczema 08/02/2021  ? Menorrhagia 08/02/2021  ? Vitamin D deficiency 08/02/2021  ? Chronic fatigue 08/02/2021  ? ? ?No Known Allergies ? ?Past Surgical History:  ?Procedure Laterality Date  ? right hand repair N/A   ? ? ?Social History  ? ?Tobacco Use  ? Smoking status: Former  ?  Packs/day: 0.25  ?  Years: 10.00  ?  Pack years: 2.50  ?  Types: Cigarettes  ?  Quit date: 03/05/2021  ?  Years since quitting: 0.7  ? Smokeless tobacco: Never  ?Vaping Use  ? Vaping Use: Never used  ?Substance Use Topics  ? Alcohol use: Yes  ?  Comment: social  ? Drug use: No  ? ? ? ?Medication list has been reviewed and updated. ? ?Current Meds  ?Medication Sig  ? albuterol (VENTOLIN HFA) 108 (90 Base) MCG/ACT inhaler Inhale 1-2 puffs into the lungs every 6 (six) hours as needed for wheezing or shortness of breath.  ? amphetamine-dextroamphetamine (ADDERALL XR) 20 MG 24 hr capsule Take 20 mg by mouth every morning. psych  ? atomoxetine (STRATTERA) 80 MG  capsule Take 1 capsule by mouth daily. psych  ? budesonide-formoterol (SYMBICORT) 160-4.5 MCG/ACT inhaler Inhale into the lungs. fle[place]  ? omeprazole (PRILOSEC) 20 MG capsule Take 20 mg by mouth daily. Raul Del  ? phentermine (ADIPEX-P) 37.5 MG tablet Take 37.5 mg by mouth every morning.  ? [DISCONTINUED] promethazine-dextromethorphan (PROMETHAZINE-DM) 6.25-15 MG/5ML syrup Take 5 mLs by mouth 4 (four) times daily as needed for cough.  ? ? ?PHQ 2/9 Scores 06/15/2021 05/24/2021  ?PHQ - 2 Score 0 0  ?PHQ- 9 Score 6 0  ? ? ?GAD 7 : Generalized  Anxiety Score 06/15/2021 05/24/2021  ?Nervous, Anxious, on Edge 0 2  ?Control/stop worrying 1 1  ?Worry too much - different things 2 2  ?Trouble relaxing 2 1  ?Restless 3 1  ?Easily annoyed or irritable 1 1  ?Afraid - awful might happen 0 1  ?Total GAD 7 Score 9 9  ?Anxiety Difficulty Somewhat difficult Very difficult  ? ? ?BP Readings from Last 3 Encounters:  ?11/22/21 138/80  ?09/21/21 120/84  ?08/04/21 137/88  ? ? ?Physical Exam ?Vitals and nursing note reviewed.  ?Constitutional:   ?   Appearance: She is well-developed.  ?HENT:  ?   Head: Normocephalic.  ?   Right Ear: Ear canal and external ear normal. A middle ear effusion is present.  ?   Left Ear: Ear canal and external ear normal.  No middle ear effusion. Tympanic membrane is not retracted.  ?   Nose: No congestion or rhinorrhea.  ?Eyes:  ?   General: Lids are everted, no foreign bodies appreciated. No scleral icterus.    ?   Left eye: No foreign body or hordeolum.  ?   Conjunctiva/sclera: Conjunctivae normal.  ?   Right eye: Right conjunctiva is not injected.  ?   Left eye: Left conjunctiva is not injected.  ?   Pupils: Pupils are equal, round, and reactive to light.  ?Neck:  ?   Thyroid: No thyromegaly.  ?   Vascular: No JVD.  ?   Trachea: No tracheal deviation.  ?Cardiovascular:  ?   Rate and Rhythm: Normal rate and regular rhythm.  ?   Heart sounds: Normal heart sounds. No murmur heard. ?  No friction rub. No gallop.  ?Pulmonary:  ?   Effort: Pulmonary effort is normal. No respiratory distress.  ?   Breath sounds: Normal breath sounds. No wheezing, rhonchi or rales.  ?Abdominal:  ?   General: Bowel sounds are normal.  ?   Palpations: Abdomen is soft. There is no mass.  ?   Tenderness: There is no abdominal tenderness. There is no guarding or rebound.  ?Musculoskeletal:     ?   General: No tenderness. Normal range of motion.  ?   Cervical back: Normal range of motion and neck supple.  ?Lymphadenopathy:  ?   Cervical: No cervical adenopathy.  ?Skin: ?    General: Skin is warm.  ?   Findings: No erythema or rash.  ?Neurological:  ?   Mental Status: She is alert and oriented to person, place, and time.  ?   Cranial Nerves: No cranial nerve deficit.  ?   Deep Tendon Reflexes: Reflexes normal.  ?Psychiatric:     ?   Mood and Affect: Mood is not anxious or depressed.  ? ? ?Wt Readings from Last 3 Encounters:  ?11/22/21 205 lb (93 kg)  ?09/21/21 213 lb (96.6 kg)  ?06/15/21 206 lb (93.4 kg)  ? ? ?BP 138/80  Pulse 72   Ht '4\' 11"'  (1.499 m)   Wt 205 lb (93 kg)   LMP 12/14/2020   BMI 41.40 kg/m?  ? ?Assessment and Plan: ? ?1. Acute maxillary sinusitis, recurrence not specified ?New onset.  Persistent.  Stable.  Examination and history is consistent with an acute maxillary sinusitis with tenderness primarily over the right maxillary sinus.  We will treat with azithromycin to 50 mg 2 tablets today followed by 1 a day for 4 days. ?- azithromycin (ZITHROMAX) 250 MG tablet; Take 2 tablets on day 1, then 1 tablet daily on days 2 through 5  Dispense: 6 tablet; Refill: 0 ? ?2. Acute cough ?Acute cough.  That is persistent in nature.  This is bothersome particularly at night we will treat with guaifenesin with codeine during the night and may substitute Mucinex DM during the day if sedation is a concern. ?- guaiFENesin-codeine (ROBITUSSIN AC) 100-10 MG/5ML syrup; Take 5 mLs by mouth 3 (three) times daily as needed for cough.  Dispense: 118 mL; Refill: 0 ? ?3. Right acute serous otitis media, recurrence not specified ?Acute.  Persistent.  There is discomfort in the right ear without drainage.  Examination notes a bulging but clear right tympanic membrane.  This may either be an early otitis media versus a serous otitis media.  We will continue Sudafed with guaifenesin as well as initiation of azithromycin. ? ?

## 2022-07-08 ENCOUNTER — Other Ambulatory Visit: Payer: Self-pay | Admitting: Obstetrics and Gynecology

## 2022-07-13 ENCOUNTER — Encounter: Payer: BC Managed Care – PPO | Admitting: Family Medicine

## 2022-08-02 IMAGING — US US BREAST*L* LIMITED INC AXILLA
1 series · 2 of 2 positions shown · non-contrast
Comparison: Previous exam(s).

CLINICAL DATA: 45-year-old female presenting with a lump and
tenderness in the left breast.

EXAM:
DIGITAL DIAGNOSTIC BILATERAL MAMMOGRAM WITH TOMOSYNTHESIS AND CAD;
ULTRASOUND LEFT BREAST LIMITED
TECHNIQUE: Bilateral digital diagnostic mammography and breast tomosynthesis
was performed. The images were evaluated with computer-aided
detection.; Targeted ultrasound examination of the left breast was
performed.

[Series 1: us breast*left* limited inc axilla · 0.08mm/px · 2 of 2 slices shown]
[im 1/2]
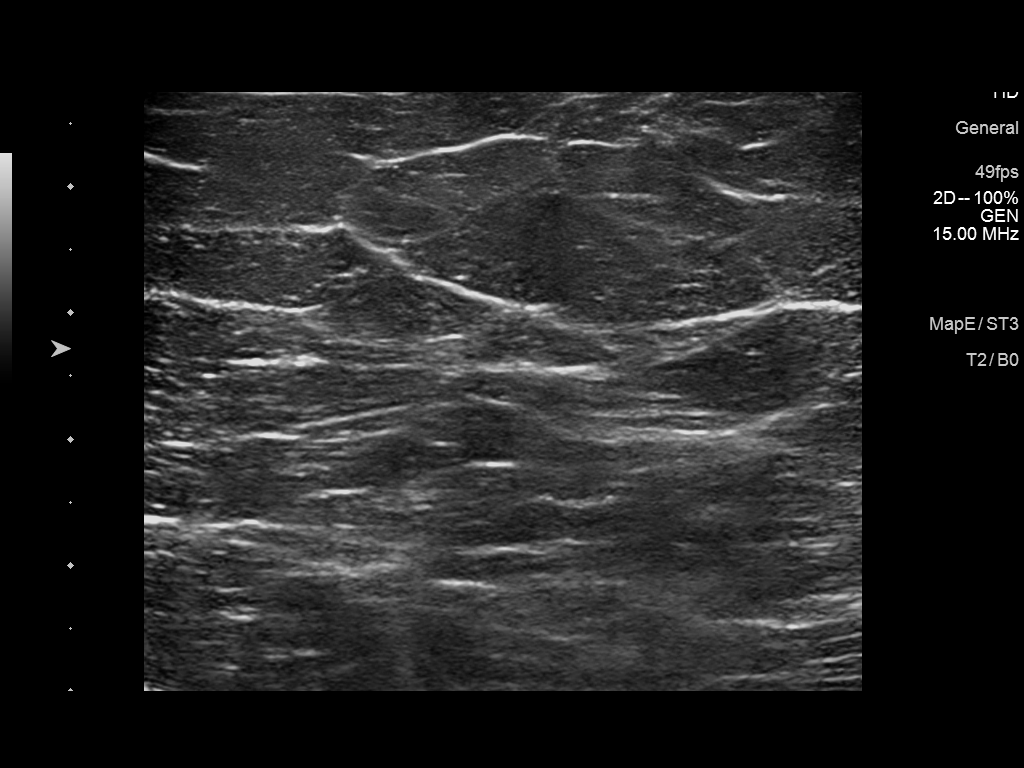
[im 2/2]
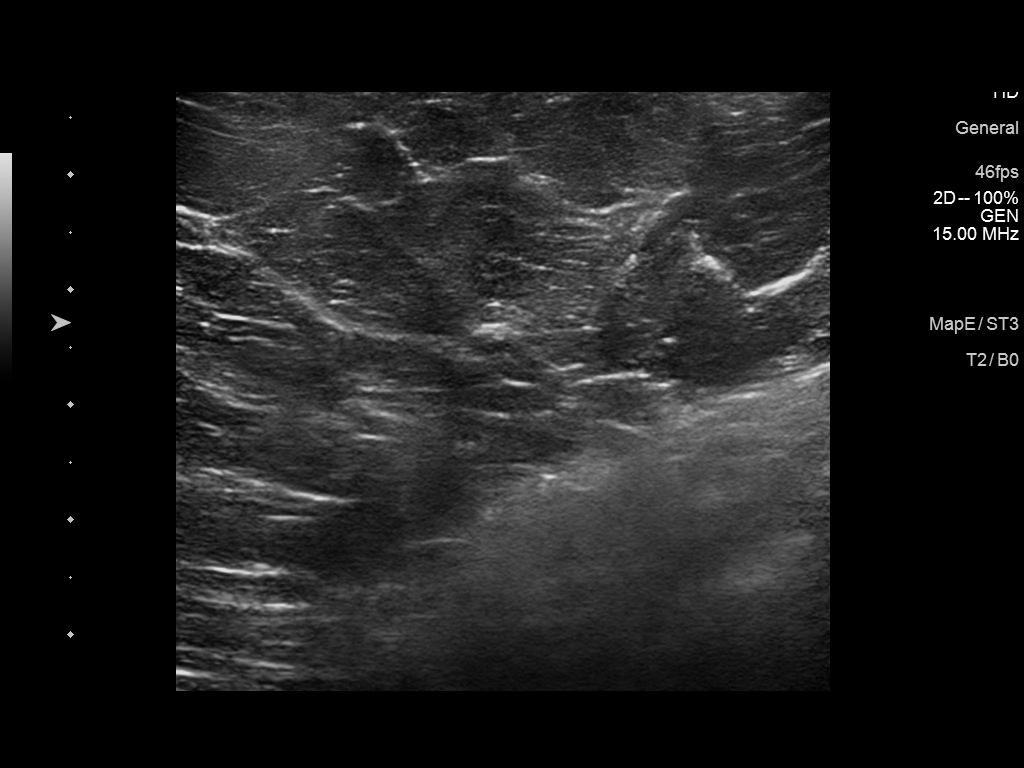

[2 of 2 positions shown; findings below may reference images not displayed]

ACR Breast Density Category b: There are scattered areas of
fibroglandular density.
FINDINGS: Mammogram:

Right breast: No suspicious mass, distortion, or microcalcifications
are identified to suggest presence of malignancy.

Left breast: A skin BB marks the palpable site of concern reported
by the patient in the lower inner left breast. A spot tangential
view of this area was performed in addition to standard views. There
is no new abnormality at the palpable site or elsewhere in the left
breast to suggest the presence of malignancy.

On physical exam at the site of concern reported by the patient I
feel a ridge of tissue without a fixed discrete mass.

Ultrasound:

Targeted ultrasound performed at the site of concern reported by the
patient in the left breast at 8 o'clock 10 cm from the nipple
demonstrating no cystic or solid mass.
IMPRESSION: 1. At the palpable site of concern reported by the patient in the
left breast there is no mammographic or sonographic evidence of
malignancy.

2.  No mammographic evidence of malignancy in the right breast.

RECOMMENDATION:
1. Recommend any further workup of the palpable site in the left
breast be on a clinical basis.

2.  Screening mammogram in one year.(Code:KQ-[id number])

I have discussed the findings and recommendations with the patient.
If applicable, a reminder letter will be sent to the patient
regarding the next appointment.

BI-RADS CATEGORY  1: Negative.

## 2022-08-04 ENCOUNTER — Other Ambulatory Visit: Payer: BC Managed Care – PPO

## 2022-08-24 ENCOUNTER — Encounter: Payer: BC Managed Care – PPO | Admitting: Family Medicine

## 2022-09-13 ENCOUNTER — Inpatient Hospital Stay
Admission: RE | Admit: 2022-09-13 | Discharge: 2022-09-13 | Disposition: A | Discharge status: Home / Self Care | Payer: BC Managed Care – PPO | Source: Ambulatory Visit

## 2022-09-13 NOTE — Patient Instructions (Signed)
Your procedure is scheduled on: 09/23/22 - Friday Report to the Registration Desk on the 1st floor of [facility]. To find out your arrival time, please call [phone] between 1PM - 3PM on: 09/22/22 - Thursday If your arrival time is 6:00 am, do not arrive prior to that time as the [facility] entrance doors do not open until 6:00 am.  REMEMBER: Instructions that are not followed completely may result in serious medical risk, up to and including death; or upon the discretion of your surgeon and anesthesiologist your surgery may need to be rescheduled.  Do not eat food after midnight the night before surgery.  No gum chewing, lozengers or hard candies.  You may however, drink CLEAR liquids up to 2 hours before you are scheduled to arrive for your surgery. Do not drink anything within 2 hours of your scheduled arrival time.  Clear liquids include: - water  - apple juice without pulp - gatorade (not RED colors) - black coffee or tea (Do NOT add milk or creamers to the coffee or tea) Do NOT drink anything that is not on this list.  In addition, your doctor has ordered for you to drink the provided  Ensure Pre-Surgery Clear Carbohydrate Drink  Gatorade G2 Drinking this carbohydrate drink up to two hours before surgery helps to reduce insulin resistance and improve patient outcomes. Please complete drinking 2 hours prior to scheduled arrival time.  TAKE THESE MEDICATIONS THE MORNING OF SURGERY WITH A SIP OF WATER:  - omeprazole (PRILOSEC) - (take one the night before and one on the morning of surgery - helps to prevent nausea after surgery.) - atomoxetine (STRATTERA)  - budesonide-formoterol (SYMBICORT)   Use inhaler albuterol (VENTOLIN HFA)  on the day of surgery and bring to the hospital.  One week prior to surgery: Stop Anti-inflammatories (NSAIDS) such as Advil, Aleve, Ibuprofen, Motrin, Naproxen, Naprosyn and Aspirin based products such as Excedrin, Goodys Powder, BC  Powder.  Stop ANY OVER THE COUNTER supplements until after surgery.  You may however, continue to take Tylenol if needed for pain up until the day of surgery.  No Alcohol for 24 hours before or after surgery.  No Smoking including e-cigarettes for 24 hours prior to surgery.  No chewable tobacco products for at least 6 hours prior to surgery.  No nicotine patches on the day of surgery.  Do not use any "recreational" drugs for at least a week prior to your surgery.  Please be advised that the combination of cocaine and anesthesia may have negative outcomes, up to and including death. If you test positive for cocaine, your surgery will be cancelled.  On the morning of surgery brush your teeth with toothpaste and water, you may rinse your mouth with mouthwash if you wish. Do not swallow any toothpaste or mouthwash.  Use CHG Soap or wipes as directed on instruction sheet.  Do not wear jewelry, make-up, hairpins, clips or nail polish.  Do not wear lotions, powders, or perfumes.   Do not shave body from the neck down 48 hours prior to surgery just in case you cut yourself which could leave a site for infection.  Also, freshly shaved skin may become irritated if using the CHG soap.  Contact lenses, hearing aids and dentures may not be worn into surgery.  Do not bring valuables to the hospital. [facility] is not responsible for any missing/lost belongings or valuables.   Notify your doctor if there is any change in your medical  condition (cold, fever, infection).  Wear comfortable clothing (specific to your surgery type) to the hospital.  After surgery, you can help prevent lung complications by doing breathing exercises.  Take deep breaths and cough every 1-2 hours. Your doctor may order a device called an Incentive Spirometer to help you take deep breaths. When coughing or sneezing, hold a pillow firmly against your incision with both hands. This is called "splinting." Doing this  helps protect your incision. It also decreases belly discomfort.  If you are being admitted to the hospital overnight, leave your suitcase in the car. After surgery it may be brought to your room.  If you are being discharged the day of surgery, you will not be allowed to drive home. You will need a responsible adult (18 years or older) to drive you home and stay with you that night.   If you are taking public transportation, you will need to have a responsible adult (18 years or older) with you. Please confirm with your physician that it is acceptable to use public transportation.   Please call the [place] [facility] at [phone] if you have any questions about these instructions.  Surgery Visitation Policy:  Patients undergoing a surgery or procedure may have two family members or support persons with them as long as the person is not COVID-19 positive or experiencing its symptoms.   Inpatient Visitation:    Visiting hours are 7 a.m. to 8 p.m. Up to four visitors are allowed at one time in a patient room. The visitors may rotate out with other people during the day. One designated support person (adult) may remain overnight.  Due to an increase in RSV and influenza rates and associated hospitalizations, children ages 34 and under will not be able to visit patients in [facility]. Masks continue to be strongly recommended.

## 2022-09-13 NOTE — Pre-Procedure Instructions (Signed)
Multiple attempts made to reach patient for her scheduled PAT phone call, 2 Voicemail messages left, will continue to reach patient.

## 2022-09-14 ENCOUNTER — Inpatient Hospital Stay
Admission: RE | Admit: 2022-09-14 | Discharge: 2022-09-14 | Disposition: A | Discharge status: Home / Self Care | Payer: BC Managed Care – PPO | Source: Ambulatory Visit

## 2022-09-14 NOTE — Pre-Procedure Instructions (Signed)
Multiple attempt to reach patient, but no answer call goes directly to voicemail. Called emergency contact on chart and gave message to have patient call me back, friend said was going to text patient to give message. Attempted to call surgeon's office to inform but not able to get through. This is the second day of phone call attempts.

## 2022-09-14 NOTE — Patient Instructions (Signed)
Your procedure is scheduled on: Friday September 23, 2022. Report to Day Surgery inside [facility] 2nd floor, stop by registration desk before getting on elevator. To find out your arrival time please call [phone] between 1PM - 3PM on Thursday September 22, 2022.  Remember: Instructions that are not followed completely may result in serious medical risk,  up to and including death, or upon the discretion of your surgeon and anesthesiologist your  surgery may need to be rescheduled.     _X__ 1. Do not eat food after midnight the night before your procedure.                 No chewing gum or hard candies. You may drink clear liquids up to 2 hours                 before you are scheduled to arrive for your surgery- DO not drink clear                 liquids within 2 hours of the start of your surgery.                 Clear Liquids include:  water, apple juice without pulp, clear Gatorade, G2 or                  Gatorade Zero (avoid Red/Purple/Blue), Black Coffee or Tea (Do not add                 anything to coffee or tea).  __X__2.   Complete the "Ensure Clear Pre-surgery Clear Carbohydrate Drink" provided to you, 2 hours before arrival. **If you are diabetic you will be provided with an alternative drink, Gatorade Zero or G2.  __X__3.  On the morning of surgery brush your teeth with toothpaste and water, you                may rinse your mouth with mouthwash if you wish.  Do not swallow any toothpaste of mouthwash.     _X__ 4  No Alcohol for 24 hours before or after surgery.   _X__ 5.  Do Not Smoke or use e-cigarettes For 24 Hours Prior to Your Surgery.                 Do not use any chewable tobacco products for at least 6 hours prior to                 Surgery.  _X__  6.  Do not use any recreational drugs (marijuana, cocaine, heroin, ecstasy, MDMA or other)                For at least one week prior to your surgery.  Combination of these drugs with  anesthesia                May have life threatening results.  ____  7.  Bring all medications with you on the day of surgery if instructed.   __X__  8.  Notify your doctor if there is any change in your medical condition      (cold, fever, infections).     Do not wear jewelry, make-up, hairpins, clips or nail polish. Do not wear lotions, powders, or perfumes. You may wear deodorant. Do not shave 48 hours prior to surgery. Men may shave face and neck. Do not bring valuables to the hospital.    [facility] is not responsible for any belongings or valuables.  Contacts, dentures or bridgework may not be worn into surgery. Leave your suitcase in the car. After surgery it may be brought to your room. For patients admitted to the hospital, discharge time is determined by your treatment team.   Patients discharged the day of surgery will not be allowed to drive home.   Make arrangements for someone to be with you for the first 24 hours of your Same Day Discharge.   __X__ Take these medicines the morning of surgery with A SIP OF WATER:    1. omeprazole (PRILOSEC) 20 MG   2. amphetamine-dextroamphetamine (ADDERALL XR) 20 MG   3. atomoxetine (STRATTERA) 80 MG   4.  5.  6.  ____ Fleet Enema (as directed)   ____ Use CHG Soap (or wipes) as directed  ____ Use Benzoyl Peroxide Gel as instructed  __X__ Use inhalers on the day of surgery  albuterol (VENTOLIN HFA) 108 (90 Base) MCG/ACT inhaler   budesonide-formoterol (SYMBICORT) 160-4.5 MCG/ACT inhaler    ____ Stop metformin 2 days prior to surgery    ____ Take 1/2 of usual insulin dose the night before surgery. No insulin the morning          of surgery.   ____ Call your PCP, cardiologist, or Pulmonologist if taking Coumadin/Plavix/aspirin and ask when to stop before your surgery.   __X__ One Week prior to surgery- Stop Anti-inflammatories such as Ibuprofen, Aleve, Advil, Motrin, meloxicam (MOBIC), diclofenac, etodolac, ketorolac,  Toradol, Daypro, piroxicam, Goody's or BC powders. OK TO USE TYLENOL IF NEEDED   __X__ Stop supplements until after surgery.    ____ Bring C-Pap to the hospital.    If you have any questions regarding your pre-procedure instructions,  Please call Pre-admit Testing at [phone]

## 2022-09-19 ENCOUNTER — Inpatient Hospital Stay
Admission: RE | Admit: 2022-09-19 | Discharge: 2022-09-19 | Disposition: A | Discharge status: Home / Self Care | Payer: BC Managed Care – PPO | Source: Ambulatory Visit

## 2022-09-19 NOTE — Pre-Procedure Instructions (Signed)
Called and spoke with Angie at Dr Towanda Octave office informing her that we have tried last week and again today to call pt for her anesthesia interview and have been unable to reach her. Angie states they can't get in touch with pt either and that she never showed up on 09-07-22 to sign her consents. Angie will try and get up with pt again to let her know we need to do her interview.

## 2022-09-21 ENCOUNTER — Inpatient Hospital Stay
Admission: RE | Admit: 2022-09-21 | Discharge: 2022-09-21 | Disposition: A | Discharge status: Home / Self Care | Payer: BC Managed Care – PPO | Source: Ambulatory Visit

## 2022-09-21 DIAGNOSIS — Z01818 Encounter for other preprocedural examination: Secondary | ICD-10-CM

## 2022-09-21 NOTE — Pre-Procedure Instructions (Addendum)
Called pt again to do anesthesia interview for upcoming 09-23-22 surgery and had to leave her another message. No more calls will be made to this pt as she has not called back and correct phone number has been verified with Dr Eduard Roux office as well. Chart sent upstairs to [facility]. Anesthesia orders placed but phone interview never completed

## 2022-09-22 MED ORDER — LACTATED RINGERS IV SOLN: INTRAVENOUS | Status: DC

## 2022-09-22 MED ORDER — FAMOTIDINE 20 MG PO TABS: 20.0000 mg | ORAL_TABLET | Freq: Once | ORAL | Status: DC

## 2022-09-22 MED ORDER — CHLORHEXIDINE GLUCONATE 0.12 % MT SOLN: 15.0000 mL | Freq: Once | OROMUCOSAL | Status: DC

## 2022-09-22 MED ORDER — ORAL CARE MOUTH RINSE: 15.0000 mL | Freq: Once | OROMUCOSAL | Status: DC

## 2022-09-23 ENCOUNTER — Encounter: Admission: RE | Payer: Self-pay | Source: Home / Self Care

## 2022-09-23 ENCOUNTER — Encounter: Payer: Self-pay | Admitting: Certified Registered Nurse Anesthetist

## 2022-09-23 ENCOUNTER — Ambulatory Visit
Admission: RE | Admit: 2022-09-23 | Payer: BC Managed Care – PPO | Source: Home / Self Care | Admitting: Obstetrics and Gynecology

## 2022-09-23 SURGERY — DILATATION AND CURETTAGE /HYSTEROSCOPY
Anesthesia: Choice

## 2023-01-03 NOTE — Progress Notes (Signed)
Sleep Medicine   Office Visit  Patient Name: Stacy Ross DOB: Apr 18, 1976 MRN [id number]    Chief Complaint: Sleep consult  Brief History:  Stacy Ross presents for an initial consult for sleep evaluation and to establish care. Patient has a long history of excessive daytime sleepiness and dozing off throughout the day. Sleep quality is poor. This is noted every nights. The patient's bed partner reports snoring at night. The patient relates the following symptoms: mild headaches, trouble concentrating, brain fogginess, and fatigue are also present. The patient goes to bed at 1000 pm but will fall asleep after midnight and wakes up at 0330 am. Sleep quality is the same when outside home environment.  Patient has noted significant movement of her legs at night that would disrupt her sleep. The patient relates sleep talking and sleep paralysis unusual behavior during the night.  The patient relates ADHD a history of psychiatric problems. Patient normally takes adderall and Strattera, but has been out of these recently. The Epworth Sleepiness Score is 10 out of 24 .  The patient relates  Cardiovascular risk factors include: None. The patient reports her dentist referred her for sleep evaluation due to grinding and reflux.   ROS  General: (-) fever, (-) chills, (-) night sweat Nose and Sinuses: (-) nasal stuffiness or itchiness, (-) postnasal drip, (-) nosebleeds, (-) sinus trouble. Mouth and Throat: (-) sore throat, (-) hoarseness. Neck: (-) swollen glands, (-) enlarged thyroid, (-) neck pain. Respiratory: - cough, - shortness of breath, + wheezing. Neurologic: + numbness, + tingling. Psychiatric: - anxiety, - depression Sleep behavior: -sleep paralysis -hypnogogic hallucinations -dream enactment      -vivid dreams -cataplexy -night terrors -sleep walking   Current Medication: Outpatient Encounter Medications as of 01/04/2023  Medication Sig   cetirizine (ZYRTEC) 10 MG tablet Take by mouth.    albuterol (VENTOLIN HFA) 108 (90 Base) MCG/ACT inhaler Inhale 1-2 puffs into the lungs every 6 (six) hours as needed for wheezing or shortness of breath.   amphetamine-dextroamphetamine (ADDERALL XR) 20 MG 24 hr capsule Take 20 mg by mouth every morning. psych   atomoxetine (STRATTERA) 80 MG capsule Take 1 capsule by mouth daily. psych   budesonide-formoterol (SYMBICORT) 160-4.5 MCG/ACT inhaler Inhale into the lungs. fleming   omeprazole (PRILOSEC) 20 MG capsule Take 20 mg by mouth daily. Meredeth Ide   [DISCONTINUED] guaiFENesin-codeine (ROBITUSSIN AC) 100-10 MG/5ML syrup Take 5 mLs by mouth 3 (three) times daily as needed for cough.   [DISCONTINUED] phentermine (ADIPEX-P) 37.5 MG tablet Take 37.5 mg by mouth every morning.   No facility-administered encounter medications on file as of 01/04/2023.    Surgical History: Past Surgical History:  Procedure Laterality Date   right hand repair N/A     Medical History: Past Medical History:  Diagnosis Date   ADD (attention deficit disorder)    Allergy    Anxiety    Asthma     Family History: Non contributory to the present illness  Social History: Social History   Socioeconomic History   Marital status: Single    Spouse name: Not on file   Number of children: Not on file   Years of education: Not on file   Highest education level: Not on file  Occupational History   Not on file  Tobacco Use   Smoking status: Former    Packs/day: 0.25    Years: 10.00    Additional pack years: 0.00    Total pack years: 2.50    Types: Cigarettes  Quit date: 03/05/2021    Years since quitting: 1.8   Smokeless tobacco: Never  Vaping Use   Vaping Use: Never used  Substance and Sexual Activity   Alcohol use: Yes    Comment: social   Drug use: No   Sexual activity: Yes  Other Topics Concern   Not on file  Social History Narrative   Not on file   Social Determinants of Health   Financial Resource Strain: Not on file  Food Insecurity: Not on  file  Transportation Needs: Not on file  Physical Activity: Not on file  Stress: Not on file  Social Connections: Not on file  Intimate Partner Violence: Not on file    Vital Signs: Blood pressure 118/73, pulse 83, resp. rate 18, height 5' (1.524 m), weight 218 lb (98.9 kg), last menstrual period 12/04/2020, SpO2 97 %. Body mass index is 42.58 kg/m.   Examination: General Appearance: The patient is well-developed, well-nourished, and in no distress. Neck Circumference: 40 cm Skin: Gross inspection of skin unremarkable. Head: normocephalic, no gross deformities. Eyes: no gross deformities noted. ENT: ears appear grossly normal Neurologic: Alert and oriented. No involuntary movements.    STOP BANG RISK ASSESSMENT S (snore) Have you been told that you snore?     YES   T (tired) Are you often tired, fatigued, or sleepy during the day?   YES  O (obstruction) Do you stop breathing, choke, or gasp during sleep? YES   P (pressure) Do you have or are you being treated for high blood pressure? NO   B (BMI) Is your body index greater than 35 kg/m? YES   A (age) Are you 27 years old or older? NO   N (neck) Do you have a neck circumference greater than 16 inches?   NO   G (gender) Are you a female? NO   TOTAL STOP/BANG "YES" ANSWERS 4                                                               A STOP-Bang score of 2 or less is considered low risk, and a score of 5 or more is high risk for having either moderate or severe OSA. For people who score 3 or 4, doctors may need to perform further assessment to determine how likely they are to have OSA.         EPWORTH SLEEPINESS SCALE:  Scale:  (0)= no chance of dozing; (1)= slight chance of dozing; (2)= moderate chance of dozing; (3)= high chance of dozing  Chance  Situtation    Sitting and reading: 1    Watching TV: 2    Sitting Inactive in public: 1    As a passenger in car: 1      Lying down to rest: 1    Sitting and  talking: 0    Sitting quielty after lunch: 2    In a car, stopped in traffic: 2   TOTAL SCORE:   10 out of 24    SLEEP STUDIES:  None   LABS: No results found for this or any previous visit (from the past 2160 hour(s)).  Radiology: No results found.  No results found.  No results found.    Assessment and Plan: Patient Active Problem List  Diagnosis Date Noted   Adult attention deficit hyperactivity disorder 08/02/2021   Allergic rhinitis due to pollen 08/02/2021   Secondary amenorrhea 08/02/2021   Eczema 08/02/2021   Menorrhagia 08/02/2021   Vitamin D deficiency 08/02/2021   Chronic fatigue 08/02/2021     PLAN OSA:   Patient evaluation suggests high risk of sleep disordered breathing due to excessive daytime sleepiness, dozing off throughout the day, snoring, mild headaches, trouble concentrating, brain fogginess, fatigue, elevated BMI.   Suggest: PSG to assess/treat the patient's sleep disordered breathing. The patient was also counselled on wt loss to optimize sleep health.   1. Hypersomnia Will order PSG  2. Adult attention deficit hyperactivity disorder Will follow up with psych  3. Morbid obesity with BMI of 40.0-44.9, adult (HCC) Obesity Counseling: Had a lengthy discussion regarding patients BMI and weight issues. Patient was instructed on portion control as well as increased activity. Also discussed caloric restrictions with trying to maintain intake less than 2000 Kcal. Discussions were made in accordance with the 5As of weight management. Simple actions such as not eating late and if able to, taking a walk is suggested.  4. Gastroesophageal reflux disease, unspecified whether esophagitis present Continue PPI    General Counseling: I have discussed the findings of the evaluation and examination with Stacy Ross.  I have also discussed any further diagnostic evaluation thatmay be needed or ordered today. Stacy Ross verbalizes understanding of the findings of  todays visit. We also reviewed her medications today and discussed drug interactions and side effects including but not limited excessive drowsiness and altered mental states. We also discussed that there is always a risk not just to her but also people around her. she has been encouraged to call the office with any questions or concerns that should arise related to todays visit.  No orders of the defined types were placed in this encounter.       I have personally obtained a history, evaluated the patient, evaluated pertinent data, formulated the assessment and plan and placed orders.  This patient was seen by Lynn Ito, PA-C in collaboration with Dr. Freda Munro as a part of collaborative care agreement.    Yevonne Pax, MD [facility] Diplomate ABMS Pulmonary and Critical Care Medicine Sleep medicine

## 2023-01-04 ENCOUNTER — Ambulatory Visit (INDEPENDENT_AMBULATORY_CARE_PROVIDER_SITE_OTHER): Payer: BC Managed Care – PPO | Admitting: Internal Medicine

## 2023-01-04 VITALS — BP 118/73 | HR 83 | Resp 18 | Ht 60.0 in | Wt 218.0 lb

## 2023-01-04 DIAGNOSIS — K219 Gastro-esophageal reflux disease without esophagitis: Secondary | ICD-10-CM

## 2023-01-04 DIAGNOSIS — G4710 Hypersomnia, unspecified: Secondary | ICD-10-CM

## 2023-01-04 DIAGNOSIS — Z6841 Body Mass Index (BMI) 40.0 and over, adult: Secondary | ICD-10-CM

## 2023-01-04 DIAGNOSIS — F909 Attention-deficit hyperactivity disorder, unspecified type: Secondary | ICD-10-CM | POA: Diagnosis not present

## 2023-07-04 ENCOUNTER — Encounter: Payer: BC Managed Care – PPO | Admitting: Family Medicine

## 2023-08-01 ENCOUNTER — Ambulatory Visit (INDEPENDENT_AMBULATORY_CARE_PROVIDER_SITE_OTHER): Payer: BC Managed Care – PPO | Admitting: Family Medicine

## 2023-08-01 ENCOUNTER — Encounter: Payer: Self-pay | Admitting: Family Medicine

## 2023-08-01 VITALS — BP 124/70 | HR 90 | Ht 60.0 in | Wt 218.0 lb

## 2023-08-01 DIAGNOSIS — Z1231 Encounter for screening mammogram for malignant neoplasm of breast: Secondary | ICD-10-CM | POA: Diagnosis not present

## 2023-08-01 DIAGNOSIS — Z1211 Encounter for screening for malignant neoplasm of colon: Secondary | ICD-10-CM | POA: Diagnosis not present

## 2023-08-01 DIAGNOSIS — Z1159 Encounter for screening for other viral diseases: Secondary | ICD-10-CM

## 2023-08-01 DIAGNOSIS — Z0000 Encounter for general adult medical examination without abnormal findings: Secondary | ICD-10-CM | POA: Diagnosis not present

## 2023-08-01 DIAGNOSIS — Z114 Encounter for screening for human immunodeficiency virus [HIV]: Secondary | ICD-10-CM

## 2023-08-01 NOTE — Progress Notes (Signed)
Date:  08/01/2023   Name:  Stacy Ross   DOB:  06-23-1976   MRN:  [id number]   Chief Complaint: Annual Exam  Patient is a 47 year old female who presents for a comprehensive physical exam. The patient reports the following problems: none. Health maintenance has been reviewed up to date      Lab Results  Component Value Date   NA 137 05/24/2021   K 4.6 05/24/2021   CO2 22 05/24/2021   GLUCOSE 91 05/24/2021   BUN 21 05/24/2021   CREATININE 0.97 05/24/2021   CALCIUM 9.1 05/24/2021   EGFR 74 05/24/2021   No results found for: "CHOL", "HDL", "LDLCALC", "LDLDIRECT", "TRIG", "CHOLHDL" Lab Results  Component Value Date   TSH 2.050 05/24/2021   No results found for: "HGBA1C" Lab Results  Component Value Date   WBC 7.4 05/24/2021   HGB 13.0 05/24/2021   HCT 40.3 05/24/2021   MCV 88 05/24/2021   PLT 310 05/24/2021   No results found for: "ALT", "AST", "GGT", "ALKPHOS", "BILITOT" No results found for: "25OHVITD2", "25OHVITD3", "VD25OH"   Review of Systems  Constitutional: Negative.  Negative for chills, fatigue, fever and unexpected weight change.  HENT:  Negative for congestion, ear discharge, ear pain, rhinorrhea, sinus pressure, sneezing and sore throat.   Respiratory:  Positive for cough, shortness of breath and wheezing. Negative for stridor.   Cardiovascular:  Negative for chest pain, palpitations and leg swelling.  Gastrointestinal:  Negative for abdominal pain, blood in stool, constipation, diarrhea and nausea.  Endocrine: Negative for polydipsia and polyuria.  Genitourinary:  Negative for dysuria, flank pain, frequency, hematuria, urgency and vaginal discharge.  Musculoskeletal:  Negative for arthralgias, back pain and myalgias.  Skin:  Negative for rash.  Neurological:  Negative for dizziness, weakness and headaches.  Hematological:  Negative for adenopathy. Does not bruise/bleed easily.  Psychiatric/Behavioral:  Negative for dysphoric mood. The patient is not  nervous/anxious.     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   Eczema 08/02/2021   Menorrhagia 08/02/2021   Vitamin D deficiency 08/02/2021   Chronic fatigue 08/02/2021    No Known Allergies  Past Surgical History:  Procedure Laterality Date   right hand repair N/A     Social History   Tobacco Use   Smoking status: Former    Current packs/day: 0.00    Average packs/day: 0.3 packs/day for 10.0 years (2.5 ttl pk-yrs)    Types: Cigarettes    Start date: 03/06/2011    Quit date: 03/05/2021    Years since quitting: 2.4   Smokeless tobacco: Never  Vaping Use   Vaping status: Never Used  Substance Use Topics   Alcohol use: Yes    Comment: social   Drug use: No     Medication list has been reviewed and updated.  Current Meds  Medication Sig   albuterol (VENTOLIN HFA) 108 (90 Base) MCG/ACT inhaler Inhale 1-2 puffs into the lungs every 6 (six) hours as needed for wheezing or shortness of breath.   budesonide-formoterol (SYMBICORT) 160-4.5 MCG/ACT inhaler Inhale into the lungs. fleming   cetirizine (ZYRTEC) 10 MG tablet Take by mouth.   omeprazole (PRILOSEC) 20 MG capsule Take 20 mg by mouth daily. Fleming       08/01/2023    8:32 AM 06/15/2021    3:32 PM 05/24/2021    2:35  PM  GAD 7 : Generalized Anxiety Score  Nervous, Anxious, on Edge 0 0 2  Control/stop worrying 0 1 1  Worry too much - different things 0 2 2  Trouble relaxing 0 2 1  Restless 1 3 1   Easily annoyed or irritable 1 1 1   Afraid - awful might happen 0 0 1  Total GAD 7 Score 2 9 9   Anxiety Difficulty Somewhat difficult Somewhat difficult Very difficult       08/01/2023    8:32 AM 06/15/2021    3:31 PM 05/24/2021    2:33 PM  Depression screen PHQ 2/9  Decreased Interest 0 0 0  Down, Depressed, Hopeless 0 0 0  PHQ - 2 Score 0 0 0  Altered sleeping  2 3 0  Tired, decreased energy 1 3 0  Change in appetite 0 0 0  Feeling bad or failure about yourself  0 0 0  Trouble concentrating 2 0 0  Moving slowly or fidgety/restless 1 0 0  Suicidal thoughts 0 0 0  PHQ-9 Score 6 6 0  Difficult doing work/chores Not difficult at all Somewhat difficult     BP Readings from Last 3 Encounters:  08/01/23 124/70  01/04/23 118/73  11/22/21 138/80    Physical Exam Vitals and nursing note reviewed. Exam conducted with a chaperone present.  Constitutional:      General: She is not in acute distress.    Appearance: She is well-developed and well-groomed. She is not diaphoretic.  HENT:     Head: Normocephalic and atraumatic.     Jaw: There is normal jaw occlusion.     Right Ear: Hearing, tympanic membrane, ear canal and external ear normal.     Left Ear: Hearing, tympanic membrane, ear canal and external ear normal.     Nose: Nose normal.     Mouth/Throat:     Lips: Pink.     Mouth: Mucous membranes are moist.     Dentition: Normal dentition.     Tongue: No lesions.     Palate: No mass.     Pharynx: Oropharynx is clear. Uvula midline.  Eyes:     General: Lids are normal. Vision grossly intact.        Right eye: No discharge.        Left eye: No discharge.     Extraocular Movements: Extraocular movements intact.     Conjunctiva/sclera: Conjunctivae normal.     Pupils: Pupils are equal, round, and reactive to light.     Funduscopic exam:    Right eye: Red reflex present.        Left eye: Red reflex present. Neck:     Thyroid: No thyroid mass, thyromegaly or thyroid tenderness.     Vascular: Normal carotid pulses. No carotid bruit, hepatojugular reflux or JVD.     Trachea: Trachea and phonation normal.  Cardiovascular:     Rate and Rhythm: Normal rate and regular rhythm.     Pulses: Normal pulses.          Carotid pulses are 2+ on the right side and 2+ on the left side.      Radial pulses are 2+ on the right side and 2+ on the left side.        Dorsalis pedis pulses are 2+ on the right side and 2+ on the left side.       Posterior tibial pulses are 2+ on the right side and 2+ on the left side.  Heart sounds: Normal heart sounds, S1 normal and S2 normal. No murmur heard.    No systolic murmur is present.     No diastolic murmur is present.     No friction rub. No gallop. No S3 or S4 sounds.  Pulmonary:     Effort: Pulmonary effort is normal.     Breath sounds: Normal breath sounds. No decreased air movement. No decreased breath sounds, wheezing, rhonchi or rales.  Chest:  Breasts:    Right: Normal. No swelling, bleeding, inverted nipple, mass, nipple discharge, skin change or tenderness.     Left: Normal. No swelling, bleeding, inverted nipple, mass, nipple discharge, skin change or tenderness.  Abdominal:     General: Bowel sounds are normal.     Palpations: Abdomen is soft. There is no mass.     Tenderness: There is no abdominal tenderness. There is no guarding.     Hernia: No hernia is present. There is no hernia in the umbilical area or ventral area.  Genitourinary:    Rectum: Normal. Guaiac result negative. No tenderness or external hemorrhoid.  Musculoskeletal:        General: Normal range of motion.     Cervical back: Full passive range of motion without pain, normal range of motion and neck supple.  Lymphadenopathy:     Head:     Right side of head: No submental or submandibular adenopathy.     Left side of head: No submental or submandibular adenopathy.     Cervical: No cervical adenopathy.     Right cervical: No superficial, deep or posterior cervical adenopathy.    Left cervical: No superficial, deep or posterior cervical adenopathy.     Upper Body:     Right upper body: No supraclavicular or axillary adenopathy.     Left upper body: No supraclavicular or axillary adenopathy.  Skin:    General: Skin is warm and dry.     Capillary Refill: Capillary refill takes less than 2 seconds.  Neurological:      General: No focal deficit present.     Mental Status: She is alert.     Cranial Nerves: Cranial nerves 2-12 are intact.     Sensory: Sensation is intact.     Motor: Motor function is intact.     Deep Tendon Reflexes: Reflexes are normal and symmetric.     Reflex Scores:      Bicep reflexes are 2+ on the right side and 2+ on the left side.      Patellar reflexes are 2+ on the right side and 2+ on the left side. Psychiatric:        Behavior: Behavior is cooperative.     Wt Readings from Last 3 Encounters:  08/01/23 218 lb (98.9 kg)  01/04/23 218 lb (98.9 kg)  11/22/21 205 lb (93 kg)    BP 124/70   Pulse 90   Ht 5' (1.524 m)   Wt 218 lb (98.9 kg)   LMP 12/04/2020   SpO2 93%   BMI 42.58 kg/m   Assessment and Plan: Stacy Ross is a 47 y.o. female who presents today for her Complete Annual Exam. She feels well. She reports exercising . She reports she is sleeping well.  Immunizations are reviewed and recommendations provided.   Age appropriate screening tests are discussed. Counseling given for risk factor reduction interventions.  1. Annual physical exam No subjective/objective concerns noted during HPI, review of past medical history/review medications/review of past labs within the last 2  visits.,  Review of systems and physical exam.  Will check CBC CMP and lipid panel. - CBC with Differential/Platelet - Comprehensive metabolic panel - Lipid Panel With LDL/HDL Ratio  2. Screen for colon cancer Patient's scheduled for gastroenterology referral for colonoscopy discussed and referral placed - Ambulatory referral to Gastroenterology  3. Screening mammogram for breast cancer Discussed.  Breast exam is normal with no palpable mass.  Will refer for screening mammogram. - MM 3D SCREENING MAMMOGRAM BILATERAL BREAST  4. Need for hepatitis C screening test One-time screening for hepatitis C antibody. - Hepatitis C antibody  5. Encounter for screening for HIV One-time screening for  HIV. - HIV Antibody (routine testing w rflx)    Elizabeth Sauer, MD

## 2023-08-02 ENCOUNTER — Encounter: Payer: Self-pay | Admitting: Family Medicine

## 2023-08-02 LAB — CBC WITH DIFFERENTIAL/PLATELET
Basophils Absolute: 0 10*3/uL (ref 0.0–0.2)
Basos: 1 %
EOS (ABSOLUTE): 0.4 10*3/uL (ref 0.0–0.4)
Eos: 8 %
Hematocrit: 38.8 % (ref 34.0–46.6)
Hemoglobin: 12.5 g/dL (ref 11.1–15.9)
Immature Grans (Abs): 0 10*3/uL (ref 0.0–0.1)
Immature Granulocytes: 0 %
Lymphocytes Absolute: 1.6 10*3/uL (ref 0.7–3.1)
Lymphs: 31 %
MCH: 27.7 pg (ref 26.6–33.0)
MCHC: 32.2 g/dL (ref 31.5–35.7)
MCV: 86 fL (ref 79–97)
Monocytes Absolute: 0.4 10*3/uL (ref 0.1–0.9)
Monocytes: 7 %
Neutrophils Absolute: 2.7 10*3/uL (ref 1.4–7.0)
Neutrophils: 53 %
Platelets: 307 10*3/uL (ref 150–450)
RBC: 4.51 x10E6/uL (ref 3.77–5.28)
RDW: 13.6 % (ref 11.7–15.4)
WBC: 5.1 10*3/uL (ref 3.4–10.8)

## 2023-08-02 LAB — COMPREHENSIVE METABOLIC PANEL
ALT: 15 [IU]/L (ref 0–32)
AST: 16 [IU]/L (ref 0–40)
Albumin: 4.3 g/dL (ref 3.9–4.9)
Alkaline Phosphatase: 81 [IU]/L (ref 44–121)
BUN/Creatinine Ratio: 26 — ABNORMAL HIGH (ref 9–23)
BUN: 21 mg/dL (ref 6–24)
Bilirubin Total: 0.5 mg/dL (ref 0.0–1.2)
CO2: 21 mmol/L (ref 20–29)
Calcium: 9.4 mg/dL (ref 8.7–10.2)
Chloride: 107 mmol/L — ABNORMAL HIGH (ref 96–106)
Creatinine, Ser: 0.82 mg/dL (ref 0.57–1.00)
Globulin, Total: 2.6 g/dL (ref 1.5–4.5)
Glucose: 92 mg/dL (ref 70–99)
Potassium: 4.5 mmol/L (ref 3.5–5.2)
Sodium: 142 mmol/L (ref 134–144)
Total Protein: 6.9 g/dL (ref 6.0–8.5)
eGFR: 90 mL/min/{1.73_m2} (ref >59)

## 2023-08-02 LAB — HEPATITIS C ANTIBODY: Hep C Virus Ab: NONREACTIVE

## 2023-08-02 LAB — LIPID PANEL WITH LDL/HDL RATIO
Cholesterol, Total: 182 mg/dL (ref 100–199)
HDL: 44 mg/dL (ref >39)
LDL Chol Calc (NIH): 114 mg/dL — ABNORMAL HIGH (ref 0–99)
LDL/HDL Ratio: 2.6 {ratio} (ref 0.0–3.2)
Triglycerides: 134 mg/dL (ref 0–149)
VLDL Cholesterol Cal: 24 mg/dL (ref 5–40)

## 2023-08-02 LAB — HIV ANTIBODY (ROUTINE TESTING W REFLEX): HIV Screen 4th Generation wRfx: NONREACTIVE

## 2023-08-14 ENCOUNTER — Encounter: Payer: Self-pay | Admitting: *Deleted

## 2023-09-12 ENCOUNTER — Encounter: Payer: Self-pay | Admitting: Family Medicine

## 2023-09-12 ENCOUNTER — Ambulatory Visit
Admission: RE | Admit: 2023-09-12 | Discharge: 2023-09-12 | Disposition: A | Discharge status: Home / Self Care | Payer: 59 | Source: Ambulatory Visit | Attending: Family Medicine | Admitting: Family Medicine

## 2023-09-12 ENCOUNTER — Other Ambulatory Visit
Admission: RE | Admit: 2023-09-12 | Discharge: 2023-09-12 | Disposition: A | Discharge status: Home / Self Care | Payer: 59 | Source: Home / Self Care | Attending: Family Medicine | Admitting: Family Medicine

## 2023-09-12 ENCOUNTER — Ambulatory Visit
Admission: RE | Admit: 2023-09-12 | Discharge: 2023-09-12 | Disposition: A | Discharge status: Home / Self Care | Payer: 59 | Attending: Family Medicine | Admitting: Family Medicine

## 2023-09-12 ENCOUNTER — Ambulatory Visit: Payer: 59 | Admitting: Family Medicine

## 2023-09-12 VITALS — BP 128/70 | Temp 99.7°F | Ht 60.0 in | Wt 217.0 lb

## 2023-09-12 DIAGNOSIS — H6593 Unspecified nonsuppurative otitis media, bilateral: Secondary | ICD-10-CM | POA: Diagnosis not present

## 2023-09-12 DIAGNOSIS — E860 Dehydration: Secondary | ICD-10-CM | POA: Diagnosis not present

## 2023-09-12 DIAGNOSIS — R0602 Shortness of breath: Secondary | ICD-10-CM

## 2023-09-12 DIAGNOSIS — R509 Fever, unspecified: Secondary | ICD-10-CM | POA: Insufficient documentation

## 2023-09-12 LAB — CBC WITH DIFFERENTIAL/PLATELET
Abs Immature Granulocytes: 0.06 10*3/uL (ref 0.00–0.07)
Basophils Absolute: 0 10*3/uL (ref 0.0–0.1)
Basophils Relative: 0 %
Eosinophils Absolute: 0.1 10*3/uL (ref 0.0–0.5)
Eosinophils Relative: 1 %
HCT: 39.4 % (ref 36.0–46.0)
Hemoglobin: 13 g/dL (ref 12.0–15.0)
Immature Granulocytes: 0 %
Lymphocytes Relative: 11 %
Lymphs Abs: 1.5 10*3/uL (ref 0.7–4.0)
MCH: 27.3 pg (ref 26.0–34.0)
MCHC: 33 g/dL (ref 30.0–36.0)
MCV: 82.8 fL (ref 80.0–100.0)
Monocytes Absolute: 1.2 10*3/uL — ABNORMAL HIGH (ref 0.1–1.0)
Monocytes Relative: 9 %
Neutro Abs: 11 10*3/uL — ABNORMAL HIGH (ref 1.7–7.7)
Neutrophils Relative %: 79 %
Platelets: 303 10*3/uL (ref 150–400)
RBC: 4.76 MIL/uL (ref 3.87–5.11)
RDW: 13.8 % (ref 11.5–15.5)
WBC: 13.9 10*3/uL — ABNORMAL HIGH (ref 4.0–10.5)
nRBC: 0 % (ref 0.0–0.2)

## 2023-09-12 LAB — RENAL FUNCTION PANEL
Albumin: 4.3 g/dL (ref 3.5–5.0)
Anion gap: 8 (ref 5–15)
BUN: 13 mg/dL (ref 6–20)
CO2: 25 mmol/L (ref 22–32)
Calcium: 8.8 mg/dL — ABNORMAL LOW (ref 8.9–10.3)
Chloride: 100 mmol/L (ref 98–111)
Creatinine, Ser: 0.94 mg/dL (ref 0.44–1.00)
GFR, Estimated: >60 mL/min (ref >60)
Glucose, Bld: 101 mg/dL — ABNORMAL HIGH (ref 70–99)
Phosphorus: 3.2 mg/dL (ref 2.5–4.6)
Potassium: 4.4 mmol/L (ref 3.5–5.1)
Sodium: 133 mmol/L — ABNORMAL LOW (ref 135–145)

## 2023-09-12 MED ORDER — AMOXICILLIN-POT CLAVULANATE 875-125 MG PO TABS: 1.0000 | ORAL_TABLET | Freq: Two times a day (BID) | ORAL | 0 refills | Status: DC

## 2023-09-12 NOTE — Progress Notes (Signed)
 Date:  09/12/2023   Name:  Stacy Ross   DOB:  02-Jan-1976   MRN:  [id number]   Chief Complaint: Flu symptoms and Covid symptoms  Otalgia  There is pain in both (L>R) ears. This is a new problem. The current episode started in the past 7 days. The problem has been gradually worsening. The pain is moderate. Associated symptoms include coughing, headaches, hearing loss and rhinorrhea. Pertinent negatives include no abdominal pain, diarrhea or ear discharge.  Fever  This is a new problem. The current episode started in the past 7 days. The problem has been gradually worsening. The maximum temperature noted was 99 to 99.9 F. Associated symptoms include congestion, coughing, ear pain, headaches and muscle aches. Pertinent negatives include no abdominal pain, chest pain, diarrhea, nausea or wheezing.    Lab Results  Component Value Date   NA 142 08/01/2023   K 4.5 08/01/2023   CO2 21 08/01/2023   GLUCOSE 92 08/01/2023   BUN 21 08/01/2023   CREATININE 0.82 08/01/2023   CALCIUM 9.4 08/01/2023   EGFR 90 08/01/2023   Lab Results  Component Value Date   CHOL 182 08/01/2023   HDL 44 08/01/2023   LDLCALC 114 (H) 08/01/2023   TRIG 134 08/01/2023   Lab Results  Component Value Date   TSH 2.050 05/24/2021   No results found for: HGBA1C Lab Results  Component Value Date   WBC 5.1 08/01/2023   HGB 12.5 08/01/2023   HCT 38.8 08/01/2023   MCV 86 08/01/2023   PLT 307 08/01/2023   Lab Results  Component Value Date   ALT 15 08/01/2023   AST 16 08/01/2023   ALKPHOS 81 08/01/2023   BILITOT 0.5 08/01/2023   No results found for: 25OHVITD2, 25OHVITD3, VD25OH   Review of Systems  Constitutional:  Positive for fever.  HENT:  Positive for congestion, ear pain, hearing loss and rhinorrhea. Negative for ear discharge.   Respiratory:  Positive for cough. Negative for wheezing.   Cardiovascular:  Negative for chest pain.  Gastrointestinal:  Negative for abdominal pain, diarrhea and  nausea.  Neurological:  Positive for headaches.    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   Eczema 08/02/2021   Menorrhagia 08/02/2021   Vitamin D deficiency 08/02/2021   Chronic fatigue 08/02/2021    No Known Allergies  Past Surgical History:  Procedure Laterality Date   right hand repair N/A     Social History   Tobacco Use   Smoking status: Former    Current packs/day: 0.00    Average packs/day: 0.3 packs/day for 10.0 years (2.5 ttl pk-yrs)    Types: Cigarettes    Start date: 03/06/2011    Quit date: 03/05/2021    Years since quitting: 2.5   Smokeless tobacco: Never  Vaping Use   Vaping status: Never Used  Substance Use Topics   Alcohol use: Yes    Comment: social   Drug use: No     Medication list has been reviewed and updated.  Current Meds  Medication Sig   albuterol  (VENTOLIN  HFA) 108 (90 Base) MCG/ACT inhaler Inhale 1-2 puffs into the lungs every 6 (six) hours as needed for wheezing or shortness of breath.   amoxicillin -clavulanate (AUGMENTIN ) 875-125 MG tablet Take 1 tablet by mouth 2 (two) times daily.   amphetamine-dextroamphetamine (ADDERALL XR) 20 MG 24 hr  capsule Take 20 mg by mouth every morning. psych   atomoxetine (STRATTERA) 80 MG capsule Take 1 capsule by mouth daily. psych   cetirizine  (ZYRTEC ) 10 MG tablet Take by mouth.   omeprazole (PRILOSEC) 20 MG capsule Take 20 mg by mouth daily. Fleming       09/12/2023    4:46 PM 09/12/2023    4:19 PM 08/01/2023    8:32 AM 06/15/2021    3:32 PM  GAD 7 : Generalized Anxiety Score  Nervous, Anxious, on Edge 0 0 0 0  Control/stop worrying 0 0 0 1  Worry too much - different things 0 0 0 2  Trouble relaxing 2 0 0 2  Restless 1 0 1 3  Easily annoyed or irritable 0 0 1 1  Afraid - awful might happen 0 0 0 0  Total GAD 7 Score 3 0 2 9  Anxiety Difficulty  Not difficult at all Not difficult at all Somewhat difficult Somewhat difficult       09/12/2023    4:44 PM 09/12/2023    4:43 PM 09/12/2023    4:19 PM  Depression screen PHQ 2/9  Decreased Interest 0 0 0  Down, Depressed, Hopeless 0 0 0  PHQ - 2 Score 0 0 0  Altered sleeping 2 2 0  Tired, decreased energy 2 2 0  Change in appetite 2 2 0  Feeling bad or failure about yourself  0 0 0  Trouble concentrating 3 3 0  Moving slowly or fidgety/restless 2 2 0  Suicidal thoughts 0 0 0  PHQ-9 Score 11 11 0  Difficult doing work/chores Somewhat difficult Somewhat difficult Not difficult at all    BP Readings from Last 3 Encounters:  09/12/23 128/70  08/01/23 124/70  01/04/23 118/73    Physical Exam Vitals and nursing note reviewed.  Constitutional:      General: She is not in acute distress.    Appearance: She is not toxic-appearing.  HENT:     Head: Normocephalic.     Right Ear: Tympanic membrane is erythematous.     Left Ear: Tympanic membrane is erythematous.     Nose:     Right Turbinates: Swollen.     Left Turbinates: Swollen.     Mouth/Throat:     Lips: Pink.     Mouth: Mucous membranes are dry.     Pharynx: Oropharynx is clear. No oropharyngeal exudate or posterior oropharyngeal erythema.  Cardiovascular:     Heart sounds: Normal heart sounds, S1 normal and S2 normal. No murmur heard.    No systolic murmur is present.     No diastolic murmur is present.     No S3 or S4 sounds.  Pulmonary:     Breath sounds: No decreased breath sounds, wheezing, rhonchi or rales.     Comments: E/I increased Abdominal:     Palpations: Abdomen is soft. There is no hepatomegaly or splenomegaly.  Musculoskeletal:     Cervical back: Neck supple.  Lymphadenopathy:     Cervical: No cervical adenopathy.     Wt Readings from Last 3 Encounters:  09/12/23 217 lb (98.4 kg)  08/01/23 218 lb (98.9 kg)  01/04/23 218 lb (98.9 kg)    BP 128/70   Temp 99.7 F (37.6 C)   Ht 5' (1.524 m)   Wt  217 lb (98.4 kg)   LMP 12/04/2020   BMI 42.38 kg/m   Assessment and Plan: .1. Fever and chills (Primary) Acute.  Relatively stable in  that the patient although feels like crap does not appear toxic.  Uncontrolled and persistent.  Chest x-ray is unremarkable for pneumonia however this is still behaving like a pneumonia and we will continue with the Augmentin  875 mg twice a day.  Leukocytes are elevated in the 13,000 range with a neutrophil shift.  This is also consistent either with the otitis media and the possibility of a pneumonia that is not showing up because the patient is dehydrated and is not evident on spray for that reason.  Awaiting renal function panel encourage fluids fever control low threshold to go to the ER if conditions worsen continue Symbicort albuterol  and will recheck on Thursday.  Patient is encouraged to continue Tamiflu  to completion. - DG Chest 2 View - CBC with Differential/Platelet - Renal Function Panel  2. Bilateral otitis media with effusion Bilateral otitis media is noted right greater than left proceed with Augmentin  875 mg twice a day.  3. Dehydration Patient was unable to readily give blood in the ER setting had to be hydrated to be able to find a vein this is consistent with her dehydration and patient has been encouraged to increase fluid intake awaiting renal function assuming that we will probably see decreased GFR and increased creatinine. - Renal Function Panel  4. Shortness of breath Patient has baseline reactive airway disease on Symbicort and albuterol  we will concur to continue on this regimen.  Awaiting chest x-ray which does not show any pneumonia.  Will recheck patient on Thursday. - DG Chest 2 View - CBC with Differential/Platelet - Renal Function Panel     Cathryne Molt, MD

## 2023-09-14 ENCOUNTER — Encounter: Payer: Self-pay | Admitting: Family Medicine

## 2023-09-14 ENCOUNTER — Ambulatory Visit (INDEPENDENT_AMBULATORY_CARE_PROVIDER_SITE_OTHER): Payer: 59 | Admitting: Family Medicine

## 2023-09-14 VITALS — BP 124/78 | HR 92 | Temp 98.3°F | Ht 60.0 in | Wt 217.0 lb

## 2023-09-14 DIAGNOSIS — R197 Diarrhea, unspecified: Secondary | ICD-10-CM

## 2023-09-14 DIAGNOSIS — R6889 Other general symptoms and signs: Secondary | ICD-10-CM

## 2023-09-14 DIAGNOSIS — R509 Fever, unspecified: Secondary | ICD-10-CM | POA: Diagnosis not present

## 2023-09-14 DIAGNOSIS — R438 Other disturbances of smell and taste: Secondary | ICD-10-CM | POA: Diagnosis not present

## 2023-09-14 LAB — POC COVID19 BINAXNOW: SARS Coronavirus 2 Ag: POSITIVE — AB

## 2023-09-14 NOTE — Progress Notes (Signed)
 Date:  09/14/2023   Name:  Stacy Ross   DOB:  21-Oct-1975   MRN:  [id number]   Chief Complaint: Dehydration (Follow up from 2 days ago.)  Fever  This is a new problem. The current episode started in the past 7 days. The problem has been gradually improving. Associated symptoms include congestion, diarrhea, ear pain, headaches and muscle aches. Pertinent negatives include no abdominal pain, chest pain, coughing, nausea, rash, sleepiness, sore throat, urinary pain, vomiting or wheezing. She has tried nothing for the symptoms. The treatment provided no relief.    Lab Results  Component Value Date   NA 133 (L) 09/12/2023   K 4.4 09/12/2023   CO2 25 09/12/2023   GLUCOSE 101 (H) 09/12/2023   BUN 13 09/12/2023   CREATININE 0.94 09/12/2023   CALCIUM 8.8 (L) 09/12/2023   EGFR 90 08/01/2023   GFRNONAA >60 09/12/2023   Lab Results  Component Value Date   CHOL 182 08/01/2023   HDL 44 08/01/2023   LDLCALC 114 (H) 08/01/2023   TRIG 134 08/01/2023   Lab Results  Component Value Date   TSH 2.050 05/24/2021   No results found for: HGBA1C Lab Results  Component Value Date   WBC 13.9 (H) 09/12/2023   HGB 13.0 09/12/2023   HCT 39.4 09/12/2023   MCV 82.8 09/12/2023   PLT 303 09/12/2023   Lab Results  Component Value Date   ALT 15 08/01/2023   AST 16 08/01/2023   ALKPHOS 81 08/01/2023   BILITOT 0.5 08/01/2023   No results found for: 25OHVITD2, 25OHVITD3, VD25OH   Review of Systems  Constitutional:  Positive for fever.  HENT:  Positive for congestion and ear pain. Negative for sore throat.   Eyes:  Negative for photophobia and visual disturbance.  Respiratory:  Negative for apnea, cough, choking, chest tightness, shortness of breath, wheezing and stridor.   Cardiovascular:  Negative for chest pain, palpitations and leg swelling.  Gastrointestinal:  Positive for diarrhea. Negative for abdominal pain, blood in stool, nausea and vomiting.  Genitourinary:  Negative for  decreased urine volume, dysuria, genital sores and urgency.  Skin:  Negative for rash.  Neurological:  Positive for headaches.    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   Eczema 08/02/2021   Menorrhagia 08/02/2021   Vitamin D deficiency 08/02/2021   Chronic fatigue 08/02/2021    No Known Allergies  Past Surgical History:  Procedure Laterality Date   right hand repair N/A     Social History   Tobacco Use   Smoking status: Former    Current packs/day: 0.00    Average packs/day: 0.3 packs/day for 10.0 years (2.5 ttl pk-yrs)    Types: Cigarettes    Start date: 03/06/2011    Quit date: 03/05/2021    Years since quitting: 2.5   Smokeless tobacco: Never  Vaping Use   Vaping status: Never Used  Substance Use Topics   Alcohol use: Yes    Comment: social   Drug use: No     Medication list has been reviewed and updated.  Current Meds  Medication Sig   albuterol  (VENTOLIN  HFA) 108 (90 Base) MCG/ACT inhaler Inhale 1-2 puffs into the lungs every 6 (six) hours as needed for wheezing or shortness of breath.   amoxicillin -clavulanate (AUGMENTIN ) 875-125 MG tablet Take 1 tablet by mouth 2 (two) times  daily.   amphetamine-dextroamphetamine (ADDERALL XR) 20 MG 24 hr capsule Take 20 mg by mouth every morning. psych   atomoxetine (STRATTERA) 80 MG capsule Take 1 capsule by mouth daily. psych   cetirizine  (ZYRTEC ) 10 MG tablet Take by mouth.   omeprazole (PRILOSEC) 20 MG capsule Take 20 mg by mouth daily. Fleming       09/12/2023    4:46 PM 09/12/2023    4:19 PM 08/01/2023    8:32 AM 06/15/2021    3:32 PM  GAD 7 : Generalized Anxiety Score  Nervous, Anxious, on Edge 0 0 0 0  Control/stop worrying 0 0 0 1  Worry too much - different things 0 0 0 2  Trouble relaxing 2 0 0 2  Restless 1 0 1 3  Easily annoyed or irritable 0 0 1 1   Afraid - awful might happen 0 0 0 0  Total GAD 7 Score 3 0 2 9  Anxiety Difficulty Not difficult at all Not difficult at all Somewhat difficult Somewhat difficult       09/14/2023   10:01 AM 09/14/2023   10:00 AM 09/12/2023    4:44 PM  Depression screen PHQ 2/9  Decreased Interest 0 0 0  Down, Depressed, Hopeless 0 0 0  PHQ - 2 Score 0 0 0  Altered sleeping 0 2 2  Tired, decreased energy 0 2 2  Change in appetite 0 2 2  Feeling bad or failure about yourself  0 0 0  Trouble concentrating 1 2 3   Moving slowly or fidgety/restless 0 2 2  Suicidal thoughts 0 0 0  PHQ-9 Score 1 10 11   Difficult doing work/chores Not difficult at all Not difficult at all Somewhat difficult    BP Readings from Last 3 Encounters:  09/14/23 124/78  09/12/23 128/70  08/01/23 124/70    Physical Exam Vitals and nursing note reviewed.  Constitutional:      General: She is not in acute distress.    Appearance: She is not diaphoretic.  HENT:     Head: Normocephalic and atraumatic.     Right Ear: Tympanic membrane, ear canal and external ear normal.     Left Ear: Tympanic membrane, ear canal and external ear normal.     Nose: Nose normal. No congestion or rhinorrhea.     Mouth/Throat:     Mouth: Mucous membranes are moist.     Pharynx: No oropharyngeal exudate or posterior oropharyngeal erythema.  Eyes:     General:        Right eye: No discharge.        Left eye: No discharge.     Conjunctiva/sclera: Conjunctivae normal.     Pupils: Pupils are equal, round, and reactive to light.  Neck:     Thyroid : No thyromegaly.     Vascular: No JVD.  Cardiovascular:     Rate and Rhythm: Normal rate and regular rhythm.     Heart sounds: Normal heart sounds. No murmur heard.    No friction rub. No gallop.  Pulmonary:     Effort: Pulmonary effort is normal.     Breath sounds: Normal breath sounds. No wheezing, rhonchi or rales.  Abdominal:     General: Bowel sounds are normal.     Palpations: Abdomen is  soft. There is no mass.     Tenderness: There is no abdominal tenderness. There is no guarding.  Musculoskeletal:        General: Normal range of motion.  Cervical back: Normal range of motion and neck supple.  Lymphadenopathy:     Cervical: No cervical adenopathy.  Skin:    General: Skin is warm and dry.  Neurological:     Mental Status: She is alert.     Deep Tendon Reflexes: Reflexes are normal and symmetric.     Wt Readings from Last 3 Encounters:  09/14/23 217 lb (98.4 kg)  09/12/23 217 lb (98.4 kg)  08/01/23 218 lb (98.9 kg)    BP 124/78   Pulse 92   Temp 98.3 F (36.8 C) (Oral)   Ht 5' (1.524 m)   Wt 217 lb (98.4 kg)   LMP 12/04/2020   SpO2 95%   BMI 42.38 kg/m   Assessment and Plan: 1. Fever and chills (Primary) New onset.  Resolving.  Gradually improving with current medication.  On review patient was told that she had a negative COVID and influenza test as well as the physician.  But during the course of time patient lost her taste and smell capabilities and reports that even though she is feeling better that this has not resolved.  Concerns that she still may have COVID despite the previous negative test that was reported we will repeat a COVID test.  Upon repetition of the COVID test it was noted to be negative so we will probably in the transit.  That it was negative and that it evolved to a positive test.  Patient is outside the window for treatment with Paxlovid but we will continue with current treatment plan seeing that she is gradually improving.  2. Flu-like symptoms Because of the flulike symptoms and the recent loss of taste and smell we will repeat the COVID as noted above. - POC COVID-19     Cathryne Molt, MD

## 2023-09-18 ENCOUNTER — Ambulatory Visit: Payer: Self-pay

## 2023-09-18 NOTE — Telephone Encounter (Signed)
  Chief Complaint: Ear pain - popping - fullness, cough congestion Symptoms: above Frequency: Ongoing Pertinent Negatives: Patient denies  Disposition: [] ED /[] Urgent Care (no appt availability in office) / [] Appointment(In office/virtual)/ []  [place] [facility]/ [] Home Care/ [] Refused Recommended Disposition /[] [place] [facility]/ [x]  Follow-up with PCP Additional Notes: Pt was seen last week and is feeling much better. Her ear remains painful. She has to pop the ear a lot. She has also developed congestion. She has a cough. Pt would like additional abx prescribed for ear pain and perhaps drops for the pain. Cough is fairly significant. Pt would like medications called in. Please advise. Reason for Disposition  [1] SEVERE pain AND [2] not improved 2 hours after taking analgesic medication (e.g., ibuprofen or acetaminophen )  Answer Assessment - Initial Assessment Questions 1. LOCATION: Which ear is involved?     Left ear 2. ONSET: When did the ear start hurting      Still hurting 3. SEVERITY: How bad is the pain?  (Scale 1-10; mild, moderate or severe)   - MILD (1-3): doesn't interfere with normal activities    - MODERATE (4-7): interferes with normal activities or awakens from sleep    - SEVERE (8-10): excruciating pain, unable to do any normal activities      moderate 4. URI SYMPTOMS: Do you have a runny nose or cough?     yes 5. FEVER: Do you have a fever? If Yes, ask: What is your temperature, how was it measured, and when did it start?     no 7. OTHER SYMPTOMS: Do you have any other symptoms? (e.g., headache, stiff neck, dizziness, vomiting, runny nose, decreased hearing)     Sinus congestion - ear popping  Protocols used: Earache-A-AH

## 2023-09-19 ENCOUNTER — Other Ambulatory Visit: Payer: Self-pay

## 2023-09-19 ENCOUNTER — Encounter: Payer: Self-pay | Admitting: *Deleted

## 2023-09-19 MED ORDER — OFLOXACIN 0.3 % OT SOLN: 3.0000 [drp] | Freq: Two times a day (BID) | OTIC | 0 refills | Status: DC

## 2023-09-19 NOTE — Telephone Encounter (Signed)
 Spoke with patient. Send in ear drops per Dr Yetta Barre. Told patient to follow up with her ENT doctor.  - Stacy Ross

## 2023-09-28 ENCOUNTER — Telehealth: Payer: Self-pay

## 2023-09-28 NOTE — Telephone Encounter (Signed)
Pt requesting call back to schedule colonoscopy.

## 2023-09-29 ENCOUNTER — Other Ambulatory Visit: Payer: Self-pay

## 2023-09-29 ENCOUNTER — Telehealth: Payer: Self-pay

## 2023-09-29 DIAGNOSIS — Z1211 Encounter for screening for malignant neoplasm of colon: Secondary | ICD-10-CM

## 2023-09-29 MED ORDER — NA SULFATE-K SULFATE-MG SULF 17.5-3.13-1.6 GM/177ML PO SOLN: 1.0000 | Freq: Once | ORAL | 0 refills | Status: AC

## 2023-09-29 NOTE — Telephone Encounter (Signed)
Gastroenterology Pre-Procedure Review  Request Date: 10/17/23 Requesting Physician: Dr. Allegra Lai  PATIENT REVIEW QUESTIONS: The patient responded to the following health history questions as indicated:    1. Are you having any GI issues? no 2. Do you have a personal history of Polyps? no 3. Do you have a family history of Colon Cancer or Polyps? yes (mother colon polyps) 4. Diabetes Mellitus? no 5. Joint replacements in the past 12 months?no 6. Major health problems in the past 3 months?no 7. Any artificial heart valves, MVP, or defibrillator?no    MEDICATIONS & ALLERGIES:    Patient reports the following regarding taking any anticoagulation/antiplatelet therapy:   Plavix, Coumadin, Eliquis, Xarelto, Lovenox, Pradaxa, Brilinta, or Effient? no Aspirin? no  Patient confirms/reports the following medications:  Current Outpatient Medications  Medication Sig Dispense Refill   albuterol (VENTOLIN HFA) 108 (90 Base) MCG/ACT inhaler Inhale 1-2 puffs into the lungs every 6 (six) hours as needed for wheezing or shortness of breath. 1 g 0   amoxicillin-clavulanate (AUGMENTIN) 875-125 MG tablet Take 1 tablet by mouth 2 (two) times daily. 20 tablet 0   amphetamine-dextroamphetamine (ADDERALL XR) 20 MG 24 hr capsule Take 20 mg by mouth every morning. psych     atomoxetine (STRATTERA) 80 MG capsule Take 1 capsule by mouth daily. psych     budesonide-formoterol (SYMBICORT) 160-4.5 MCG/ACT inhaler Inhale into the lungs. fleming     cetirizine (ZYRTEC) 10 MG tablet Take by mouth.     ofloxacin (FLOXIN) 0.3 % OTIC solution Place 3 drops into the left ear 2 (two) times daily. 5 mL 0   omeprazole (PRILOSEC) 20 MG capsule Take 20 mg by mouth daily. Fleming     No current facility-administered medications for this visit.    Patient confirms/reports the following allergies:  No Known Allergies  No orders of the defined types were placed in this encounter.   AUTHORIZATION INFORMATION Primary  Insurance: 1D#: Group #:  Secondary Insurance: 1D#: Group #:  SCHEDULE INFORMATION: Date: 10/17/23 Time: Location: [facility]

## 2023-10-02 ENCOUNTER — Other Ambulatory Visit: Payer: Self-pay | Admitting: Family Medicine

## 2023-10-03 ENCOUNTER — Ambulatory Visit: Payer: Self-pay

## 2023-10-03 ENCOUNTER — Encounter: Payer: Self-pay | Admitting: Family Medicine

## 2023-10-03 ENCOUNTER — Ambulatory Visit: Payer: 59 | Admitting: Family Medicine

## 2023-10-03 VITALS — BP 128/74 | HR 92 | Ht 60.0 in | Wt 217.0 lb

## 2023-10-03 DIAGNOSIS — H6012 Cellulitis of left external ear: Secondary | ICD-10-CM | POA: Diagnosis not present

## 2023-10-03 DIAGNOSIS — H663X2 Other chronic suppurative otitis media, left ear: Secondary | ICD-10-CM | POA: Diagnosis not present

## 2023-10-03 MED ORDER — SULFAMETHOXAZOLE-TRIMETHOPRIM 800-160 MG PO TABS: 1.0000 | ORAL_TABLET | Freq: Two times a day (BID) | ORAL | 0 refills | Status: DC

## 2023-10-03 NOTE — Telephone Encounter (Addendum)
  Chief Complaint:  clear and sticky left ear drainage and both ears ache Symptoms:  left ear red and swollen left lobe and 2 cm below ear lobe, stated lobe feels hard, worried it is a bug bite Frequency: today Pertinent Negatives: Patient denies blood, fever Disposition: [] ED /[] Urgent Care (no appt availability in office) / [x] Appointment(In office/virtual)/ []  [place] [facility]/ [] Home Care/ [] Refused Recommended Disposition /[] [place] [facility]/ []  Follow-up with PCP Additional Notes: appt this afternoon scheduled with PCP/ pt was plugging ear with cotton ball and has been using hydrogen peroxide to clean out Reason for Disposition  [1] Clear drainage (not from a head injury) AND [2] persists > 24 hours  Answer Assessment - Initial Assessment Questions 1. LOCATION: "Which ear is involved?"     *No Answer* 2. ONSET: "When did the ear start hurting"      *No Answer* 3. SEVERITY: "How bad is the pain?"  (Scale 1-10; mild, moderate or severe)   - MILD (1-3): doesn't interfere with normal activities    - MODERATE (4-7): interferes with normal activities or awakens from sleep    - SEVERE (8-10): excruciating pain, unable to do any normal activities   4. URI SYMPTOMS: "Do you have a runny nose or cough?"     *No Answer* 5. FEVER: "Do you have a fever?" If Yes, ask: "What is your temperature, how was it measured, and when did it start?"     *No Answer* 6. CAUSE: "Have you been swimming recently?", "How often do you use Q-TIPS?", "Have you had any recent air travel or scuba diving?"     *No Answer* 7. OTHER SYMPTOMS: "Do you have any other symptoms?" (e.g., headache, stiff neck, dizziness, vomiting, runny nose, decreased hearing)  8. PREGNANCY: "Is there any chance you are pregnant?" "When was your last menstrual period?"     *No Answer*  Answer Assessment - Initial Assessment Questions 1. LOCATION: "Which ear is involved?"      Left ear but both ears ache 2. COLOR: "What is  the color of the discharge?"      Clear and sticky 3. CONSISTENCY: "How runny is the discharge? Could it be water?"      Sticky  4. ONSET: "When did you first notice the discharge?"     Sunday  5. PAIN: "Is there any earache?" "How bad is it?"  (Scale 1-10; or mild, moderate, severe)     Yes mild  6. OBJECTS: "Have you put anything in your ear?" (e.g., Q-tip, other object)      Ear drops under lobe to 2 cm down swollen  7. OTHER SYMPTOMS: "Do you have any other symptoms?" (e.g., headache, fever, dizziness, vomiting, runny nose)     Eyes red, entire ear warm to touch, 2 cm down, lobe feels hard. Throat clearing- hearing  8. PREGNANCY: "Is there any chance you are pregnant?" "When was your last menstrual period?"     N/a  Protocols used: Earache-A-AH, Ear - Discharge-A-AH

## 2023-10-03 NOTE — Telephone Encounter (Signed)
Agent Olegario Messier was unable to transfer call to this NT. Called pt back and LM on VM to call back.

## 2023-10-03 NOTE — Progress Notes (Signed)
Date:  10/03/2023   Name:  Stacy Ross   DOB:  10-03-1975   MRN:  [id number]   Chief Complaint: Ear Pain (Left ear swelling and pain. Itching. Started Sunday - 3 days ago.)  HPI  Lab Results  Component Value Date   NA 133 (L) 09/12/2023   K 4.4 09/12/2023   CO2 25 09/12/2023   GLUCOSE 101 (H) 09/12/2023   BUN 13 09/12/2023   CREATININE 0.94 09/12/2023   CALCIUM 8.8 (L) 09/12/2023   EGFR 90 08/01/2023   GFRNONAA >60 09/12/2023   Lab Results  Component Value Date   CHOL 182 08/01/2023   HDL 44 08/01/2023   LDLCALC 114 (H) 08/01/2023   TRIG 134 08/01/2023   Lab Results  Component Value Date   TSH 2.050 05/24/2021   No results found for: "HGBA1C" Lab Results  Component Value Date   WBC 13.9 (H) 09/12/2023   HGB 13.0 09/12/2023   HCT 39.4 09/12/2023   MCV 82.8 09/12/2023   PLT 303 09/12/2023   Lab Results  Component Value Date   ALT 15 08/01/2023   AST 16 08/01/2023   ALKPHOS 81 08/01/2023   BILITOT 0.5 08/01/2023   No results found for: "25OHVITD2", "25OHVITD3", "VD25OH"   Review of Systems  Constitutional:  Negative for chills and fever.  HENT:  Positive for ear discharge, ear pain and facial swelling. Negative for postnasal drip, rhinorrhea, sinus pressure, sinus pain and sneezing.   Eyes:  Negative for redness and visual disturbance.    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   Eczema 08/02/2021   Menorrhagia 08/02/2021   Vitamin D deficiency 08/02/2021   Chronic fatigue 08/02/2021    No Known Allergies  Past Surgical History:  Procedure Laterality Date   right hand repair N/A     Social History   Tobacco Use   Smoking status: Former    Current packs/day: 0.00    Average packs/day: 0.3 packs/day for 10.0 years (2.5 ttl pk-yrs)    Types: Cigarettes    Start date: 03/06/2011    Quit date:  03/05/2021    Years since quitting: 2.5   Smokeless tobacco: Never  Vaping Use   Vaping status: Never Used  Substance Use Topics   Alcohol use: Yes    Comment: social   Drug use: No     Medication list has been reviewed and updated.  Current Meds  Medication Sig   albuterol (VENTOLIN HFA) 108 (90 Base) MCG/ACT inhaler Inhale 1-2 puffs into the lungs every 6 (six) hours as needed for wheezing or shortness of breath.   amphetamine-dextroamphetamine (ADDERALL XR) 20 MG 24 hr capsule Take 20 mg by mouth every morning. psych   atomoxetine (STRATTERA) 80 MG capsule Take 1 capsule by mouth daily. psych   cetirizine (ZYRTEC) 10 MG tablet Take by mouth.   ofloxacin (FLOXIN) 0.3 % OTIC solution PLACE 3 DROPS INTO THE LEFT EAR 2 (TWO) TIMES DAILY   omeprazole (PRILOSEC) 20 MG capsule Take 20 mg by mouth daily. Meredeth Ide   [DISCONTINUED] amoxicillin-clavulanate (AUGMENTIN) 875-125 MG tablet Take 1 tablet by mouth 2 (two) times daily.       10/03/2023    3:35 PM 09/12/2023    4:46 PM 09/12/2023    4:19 PM 08/01/2023    8:32 AM  GAD 7 : Generalized Anxiety Score  Nervous, Anxious, on Edge 0 0 0 0  Control/stop worrying 0 0 0 0  Worry too much - different things 0 0 0 0  Trouble relaxing 0 2 0 0  Restless 1 1 0 1  Easily annoyed or irritable 2 0 0 1  Afraid - awful might happen 0 0 0 0  Total GAD 7 Score 3 3 0 2  Anxiety Difficulty Not difficult at all Not difficult at all Not difficult at all Somewhat difficult       10/03/2023    3:35 PM 09/14/2023   10:01 AM 09/14/2023   10:00 AM  Depression screen PHQ 2/9  Decreased Interest 0 0 0  Down, Depressed, Hopeless 0 0 0  PHQ - 2 Score 0 0 0  Altered sleeping 0 0 2  Tired, decreased energy 0 0 2  Change in appetite 1 0 2  Feeling bad or failure about yourself  0 0 0  Trouble concentrating 2 1 2   Moving slowly or fidgety/restless 2 0 2  Suicidal thoughts 0 0 0  PHQ-9 Score 5 1 10   Difficult doing work/chores Not difficult at all Not  difficult at all Not difficult at all    BP Readings from Last 3 Encounters:  10/03/23 128/74  09/14/23 124/78  09/12/23 128/70    Physical Exam Vitals and nursing note reviewed.  HENT:     Left Ear: Swelling and tenderness present. No drainage. Tympanic membrane is erythematous.     Ears:     Comments: Suspect perforation Musculoskeletal:     Cervical back: Neck supple.  Lymphadenopathy:     Cervical:     Right cervical: No superficial, deep or posterior cervical adenopathy.    Left cervical: No superficial, deep or posterior cervical adenopathy.  Skin:    Findings: Rash present. Rash is crusting.     Comments: Honey crusted area left ear     Wt Readings from Last 3 Encounters:  10/03/23 217 lb (98.4 kg)  09/14/23 217 lb (98.4 kg)  09/12/23 217 lb (98.4 kg)    BP 128/74   Pulse 92   Ht 5' (1.524 m)   Wt 217 lb (98.4 kg)   LMP 12/04/2020   SpO2 97%   BMI 42.38 kg/m   Assessment and Plan:  1. Chronic suppurative otitis media of left ear, unspecified otitis media location (Primary) New onset.  Sudden release of discharge from the left ear with some resolution of pain but now has developed swelling of the pinna involved and with multiple small ulcerations that are honey crusted consistent with impetigo.  Patient has a cellulitis with swelling of the pinna we will initiate sulfamethoxazole DS 1 twice a day for 10 days and patient is proceeding with trying to get in with ear nose and throat at a sooner date. - sulfamethoxazole-trimethoprim (BACTRIM DS) 800-160 MG tablet; Take 1 tablet by mouth 2 (two) times daily for 10 days.  Dispense: 20 tablet; Refill: 0  2. Cellulitis of left pinna Patient is swelling of the left pinna consistent with a cellulitis and we will treat with staph covering antibiotic sulfamethoxazole DS twice daily. - sulfamethoxazole-trimethoprim (BACTRIM DS) 800-160 MG tablet; Take 1 tablet by mouth 2 (two) times daily for 10 days.  Dispense: 20 tablet;  Refill: 0    Elizabeth Sauer, MD

## 2023-10-05 ENCOUNTER — Encounter: Payer: Self-pay | Admitting: Family Medicine

## 2023-10-06 ENCOUNTER — Other Ambulatory Visit: Payer: Self-pay | Admitting: Family Medicine

## 2023-10-06 ENCOUNTER — Encounter: Payer: Self-pay | Admitting: Anesthesiology

## 2023-10-06 MED ORDER — MUPIROCIN 2 % EX OINT: 1.0000 | TOPICAL_OINTMENT | Freq: Two times a day (BID) | CUTANEOUS | 1 refills | Status: DC

## 2023-10-10 NOTE — Progress Notes (Signed)
 Follow-up (Mild intermittent asthmatic bronchitis with acute exacerbation ([facility]))   History of Present Illness: Stacy Ross is a 48 y.o. female that presents to clinic for follow up  Problem List:  Patient Active Problem List  Diagnosis  . Amenorrhea  . Secondary amenorrhea  . Postmenopausal bleeding    Current Medications:  Current Outpatient Medications  Medication Sig Dispense Refill  . albuterol  90 mcg/actuation inhaler INHALE 2 PUFFS BY MOUTH EVERY 6 HOURS AS NEEDED FOR WHEEZE 25.5 each 3  . budesonide-formoteroL  (SYMBICORT) 160-4.5 mcg/actuation inhaler Inhale 2 inhalations into the lungs 2 (two) times daily 1 g 5  . olopatadine-mometasone  (RYALTRIS) 665-25 mcg/spray [place] 665 mcg into one nostril 2 (two) times daily 1 g 3  . omeprazole (PRILOSEC) 20 MG DR capsule TAKE 1 CAPSULE BY MOUTH EVERY DAY 90 capsule 2  . UNABLE TO FIND Allergy Shots    . atomoxetine (STRATTERA) 40 MG capsule 80 mg (Patient not taking: Reported on 07/10/2023)    . dextroamphetamine-amphetamine (ADDERALL XR) 20 MG XR capsule Take 30 mg by mouth every morning (Patient not taking: Reported on 07/10/2023)     No current facility-administered medications for this visit.    History: Past Medical History:  Diagnosis Date  . ADHD     Past Surgical History:  Procedure Laterality Date  . hand surgery Right     Family History  Problem Relation Name Age of Onset  . Skin cancer Father    . High blood pressure (Hypertension) Father    . Breast cancer Paternal Grandmother    . Heart disease Paternal Grandfather    . Diabetes Paternal Grandfather       reports that she quit smoking about 3 years ago. Her smoking use included cigarettes. She started smoking about 13 years ago. She has a 5 pack-year smoking history. She has never used smokeless tobacco. She reports current alcohol use. She reports that she does not use drugs.   Review of Systems: As per above. All systems were reviewed with her in  totality and there were no further positive findings. Presently no bleeding or bruising. No dysuria, hematuria, fever, chills, nor sweats. No lymph nodes, no rashes, TIAs, seizures, passing out spells, or suicidal ideation. No unstable angina.   Physical Exam: BP 120/78   Ht 152.4 cm (5')   Wt 98.7 kg (217 lb 9.6 oz)   LMP 05/08/2021 Comment: irregular  BMI 42.50 kg/m  98.7 kg (217 lb 9.6 oz)   General:  NAD. Able to speak in complete sentences without cough or dyspnea HEENT: Normocephalic, nontraumatic. Extraocular movements intact NECK: Supple. No JVD, nodes, thryomegaly CV: RRR no murmurs, gallops, rubs PULM: Normal respiratory effort, Clear to auscultation bilaterally without wheezing or crackles EXTREMITIESs: No significant edema, cyanosis or Homans'signs SKIN: Fair turgor. No rashes LYMPHATIC: No nodes NEURO: No gross deficits PSYCH: Appropriate affect  Component Ref Range & Units 1 yr ago    Eosinophil % 1.0 - 5.0 % 3.6   Component Ref Range & Units 1 yr ago    Immunoglobulin E, Total - [facility] 6 - 495 IU/mL 192   Component Ref Range & Units 1 yr ago    A.Fumigatus #1 Abs - [facility] Negative Negative   Micropolyspora faeni, IgG - [facility] Negative Negative   Thermoactinomyces vulgaris, IgG - [facility] Negative Negative   A. Pullulans Abs - [facility] Negative Negative   Thermoact. Saccharii - [facility] Negative Negative   Pigeon Serum Abs - [facility] Negative Negative  Resulting Agency  [facility] <redacted file path>        Impression: The bronchitic flare last visit is practically resolved.   Her rhinitis lkess present. 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 HSP is negative. ? silent gerd. Rampant allergies. On allergy shots Weight loss Albuterol  2 puffs q 6 prn, symbicort 160/4.5 two puffs q bid  Ryaltriss nose spray, zyrtec   Omeprazole 20 mg q day Nasal rinses F/u in 18 weeks with pfts     Sleep apnea, has cpap,has not worn it weeks. She had covid. Encouraged to  get back on it tonight.  On cpap ( stay on same setting)

## 2023-10-11 ENCOUNTER — Other Ambulatory Visit: Payer: Self-pay

## 2023-10-11 ENCOUNTER — Encounter: Payer: Self-pay | Admitting: Emergency Medicine

## 2023-10-11 ENCOUNTER — Encounter: Payer: Self-pay | Admitting: Family Medicine

## 2023-10-11 ENCOUNTER — Emergency Department: Payer: 59

## 2023-10-11 ENCOUNTER — Observation Stay
Admission: EM | Admit: 2023-10-11 | Discharge: 2023-10-13 | Disposition: A | Discharge status: Home / Self Care | Payer: 59 | Attending: Emergency Medicine | Admitting: Emergency Medicine

## 2023-10-11 ENCOUNTER — Ambulatory Visit
Admission: EM | Admit: 2023-10-11 | Discharge: 2023-10-11 | Payer: 59 | Attending: Family Medicine | Admitting: Family Medicine

## 2023-10-11 DIAGNOSIS — M79621 Pain in right upper arm: Secondary | ICD-10-CM | POA: Diagnosis not present

## 2023-10-11 DIAGNOSIS — Z1152 Encounter for screening for COVID-19: Secondary | ICD-10-CM | POA: Insufficient documentation

## 2023-10-11 DIAGNOSIS — R079 Chest pain, unspecified: Secondary | ICD-10-CM

## 2023-10-11 DIAGNOSIS — M79609 Pain in unspecified limb: Secondary | ICD-10-CM | POA: Diagnosis not present

## 2023-10-11 DIAGNOSIS — F1090 Alcohol use, unspecified, uncomplicated: Secondary | ICD-10-CM | POA: Insufficient documentation

## 2023-10-11 DIAGNOSIS — M79622 Pain in left upper arm: Secondary | ICD-10-CM | POA: Diagnosis not present

## 2023-10-11 DIAGNOSIS — G8929 Other chronic pain: Secondary | ICD-10-CM | POA: Insufficient documentation

## 2023-10-11 DIAGNOSIS — Z79899 Other long term (current) drug therapy: Secondary | ICD-10-CM | POA: Insufficient documentation

## 2023-10-11 DIAGNOSIS — R52 Pain, unspecified: Secondary | ICD-10-CM

## 2023-10-11 DIAGNOSIS — R030 Elevated blood-pressure reading, without diagnosis of hypertension: Secondary | ICD-10-CM

## 2023-10-11 DIAGNOSIS — R202 Paresthesia of skin: Secondary | ICD-10-CM | POA: Diagnosis not present

## 2023-10-11 DIAGNOSIS — I160 Hypertensive urgency: Secondary | ICD-10-CM

## 2023-10-11 DIAGNOSIS — Z87891 Personal history of nicotine dependence: Secondary | ICD-10-CM | POA: Diagnosis not present

## 2023-10-11 DIAGNOSIS — J45909 Unspecified asthma, uncomplicated: Secondary | ICD-10-CM

## 2023-10-11 DIAGNOSIS — R7303 Prediabetes: Secondary | ICD-10-CM | POA: Diagnosis present

## 2023-10-11 LAB — CBC
HCT: 39.9 % (ref 36.0–46.0)
Hemoglobin: 13.1 g/dL (ref 12.0–15.0)
MCH: 27.4 pg (ref 26.0–34.0)
MCHC: 32.8 g/dL (ref 30.0–36.0)
MCV: 83.5 fL (ref 80.0–100.0)
Platelets: 337 10*3/uL (ref 150–400)
RBC: 4.78 MIL/uL (ref 3.87–5.11)
RDW: 13.4 % (ref 11.5–15.5)
WBC: 5.3 10*3/uL (ref 4.0–10.5)
nRBC: 0 % (ref 0.0–0.2)

## 2023-10-11 LAB — BASIC METABOLIC PANEL
Anion gap: 11 (ref 5–15)
BUN: 14 mg/dL (ref 6–20)
CO2: 22 mmol/L (ref 22–32)
Calcium: 9.1 mg/dL (ref 8.9–10.3)
Chloride: 103 mmol/L (ref 98–111)
Creatinine, Ser: 1.04 mg/dL — ABNORMAL HIGH (ref 0.44–1.00)
GFR, Estimated: >60 mL/min (ref >60)
Glucose, Bld: 101 mg/dL — ABNORMAL HIGH (ref 70–99)
Potassium: 3.9 mmol/L (ref 3.5–5.1)
Sodium: 136 mmol/L (ref 135–145)

## 2023-10-11 LAB — RESP PANEL BY RT-PCR (RSV, FLU A&B, COVID)  RVPGX2
Influenza A by PCR: NEGATIVE
Influenza B by PCR: NEGATIVE
Resp Syncytial Virus by PCR: NEGATIVE
SARS Coronavirus 2 by RT PCR: NEGATIVE

## 2023-10-11 LAB — TROPONIN I (HIGH SENSITIVITY)
Troponin I (High Sensitivity): 4 ng/L (ref <18)
Troponin I (High Sensitivity): 6 ng/L (ref <18)

## 2023-10-11 MED ORDER — SODIUM CHLORIDE 0.9 % IV BOLUS
500.0000 mL | Freq: Once | INTRAVENOUS | Status: AC
  Administered 2023-10-11: 500 mL via INTRAVENOUS

## 2023-10-11 MED ORDER — LORAZEPAM 2 MG/ML IJ SOLN
1.0000 mg | INTRAMUSCULAR | Status: DC
  Administered 2023-10-11: 1 mg via INTRAVENOUS
  Filled 2023-10-11: qty 1

## 2023-10-11 MED ORDER — KETOROLAC TROMETHAMINE 30 MG/ML IJ SOLN
30.0000 mg | Freq: Once | INTRAMUSCULAR | Status: AC
  Administered 2023-10-11: 30 mg via INTRAVENOUS
  Filled 2023-10-11: qty 1

## 2023-10-11 MED ORDER — DROPERIDOL 2.5 MG/ML IJ SOLN
2.5000 mg | Freq: Once | INTRAMUSCULAR | Status: AC
  Administered 2023-10-12: 2.5 mg via INTRAVENOUS
  Filled 2023-10-11: qty 2

## 2023-10-11 NOTE — ED Provider Notes (Signed)
 [facility] Emergency Department Provider Note     Event Date/Time   First MD Initiated Contact with Patient 10/11/23 2101     (approximate)   History   Chest Pain   HPI  Stacy Ross is a 48 y.o. female with a history of ADHD, asthma, anxiety, and chronic fatigue syndrome, presents to the ED from a local urgent care.  Patient presents with onset of generalized myalgias and paresthesias today.  Patient initially presented to the [facility] complaining of bilateral cramping in the arms and hands as well as pain across the chest.  She presents been taking Bactrim  for the last 5 days for a cellulitis to the face.  She is dose Bactrim  in the past without evidence of other allergic reaction.  She denies any rash, cough, or congestion related to the antibiotic.  Patient presents to the ED at this time, screaming, writhing, and crying in pain noting cramping to the feet and hands bilaterally.  She would also report symptoms including temperature changes to the hand with noting that they feel, cold as ice.  Patient denies any cough, congestion, nausea, vomiting, or syncope.  She also denies any chest pain at the time of this evaluation.   Physical Exam   Triage Vital Signs: ED Triage Vitals  Encounter Vitals Group     BP 10/11/23 1732 (!) 148/86     Systolic BP Percentile --      Diastolic BP Percentile --      Pulse Rate 10/11/23 1732 89     Resp 10/11/23 1732 16     Temp 10/11/23 1732 98.1 F (36.7 C)     Temp Source 10/11/23 1732 Oral     SpO2 10/11/23 1732 98 %     Weight 10/11/23 1733 216 lb 14.9 oz (98.4 kg)     Height --      Head Circumference --      Peak Flow --      Pain Score 10/11/23 1733 10     Pain Loc --      Pain Education --      Exclude from Growth Chart --     Most recent vital signs: Vitals:   10/11/23 1732 10/11/23 2106  BP: (!) 148/86 (!) 170/89  Pulse: 89 77  Resp: 16 17  Temp: 98.1 F (36.7 C) 98.1 F (36.7 C)   SpO2: 98% 100%    General Awake, no distress. NAD. Anxious and loquacious.  HEENT NCAT. PERRL. EOMI. No rhinorrhea. Mucous membranes are moist.  Uvula is midline and tonsils are flat.  No oropharyngeal lesions appreciated. CV:  Good peripheral perfusion. RRR RESP:  Normal effort. CTA ABD:  No distention. 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  Normal composite fist bilaterally.  Normal active range of motion of all extremities. SKIN:  Skin is warm and dry.  No rash or hives appreciated. NEURO: Cranial nerves II to XII grossly intact.  ED Results / Procedures / Treatments   Labs (all labs ordered are listed, but only abnormal results are displayed) Labs Reviewed  BASIC METABOLIC PANEL - Abnormal; Notable for the following components:      Result Value   Glucose, Bld 101 (*)    Creatinine, Ser 1.04 (*)    All other components within normal limits  RESP PANEL BY RT-PCR (RSV, FLU A&B, COVID)  RVPGX2  CBC  TROPONIN I (HIGH SENSITIVITY)  TROPONIN I (HIGH SENSITIVITY)   EKG  Vent. rate 82 BPM PR interval 120 ms QRS duration 72 ms QT/QTcB 362/422 ms P-R-T axes 58 59 31 Normal sinus rhythm Nonspecific ST abnormality Abnormal ECG-  RADIOLOGY  I personally viewed and evaluated these images as part of my medical decision making, as well as reviewing the written report by the radiologist.  ED Provider Interpretation: NAD  DG Chest 2 View Result Date: 10/11/2023 CLINICAL DATA:  Chest pain radiating to the left arm EXAM: CHEST - 2 VIEW COMPARISON:  09/12/2023 FINDINGS: The heart size and mediastinal contours are within normal limits. Both lungs are clear. The visualized skeletal structures are unremarkable. IMPRESSION: No active cardiopulmonary disease. Electronically Signed   By: Norman Gatlin M.D.   On: 10/11/2023 18:34     PROCEDURES:  Critical Care performed: No  Procedures   MEDICATIONS ORDERED IN ED: Medications   LORazepam  (ATIVAN ) injection 1 mg (1 mg Intravenous Given 10/11/23 2256)  droperidol  (INAPSINE ) 2.5 MG/ML injection 2.5 mg (has no administration in time range)  ketorolac  (TORADOL ) 30 MG/ML injection 30 mg (30 mg Intravenous Given 10/11/23 2215)  sodium chloride  0.9 % bolus 500 mL (500 mLs Intravenous New Bag/Given 10/11/23 2256)     IMPRESSION / MDM / ASSESSMENT AND PLAN / ED COURSE  I reviewed the triage vital signs and the nursing notes.                              Differential diagnosis includes, but is not limited to, anaphylaxis, electrolyte abnormality, drug reaction, paresthesias, anxiety  Patient's presentation is most consistent with acute complicated illness / injury requiring diagnostic workup.  Patient's diagnosis is consistent with intractable pain to the bilateral hands (right greater than left) as well as the feet.  Patient also endorsing intermittent paresthesias to the hands and feet.  Patient with reassuring exam overall.  No evidence of any acute leukocytosis, critical anemia, or electrolyte abnormalities.  No acute neuromuscular deficits noted.  EKG without evidence of malignant arrhythmia, and viral panel test is negative.  Troponin is flat x 1 with low concern for ACS.    Plan for patient will be admitted to the hospital service for further evaluation management of intractable pain and paresthesias.  Patient with ongoing complaints of pain after IV Toradol  and Ativan .  Patient is advised of the treatment plan, and is agreeable at this time.  FINAL CLINICAL IMPRESSION(S) / ED DIAGNOSES   Final diagnoses:  Intractable pain  Paresthesias in right hand     Rx / DC Orders   ED Discharge Orders     None        Note:  This document was prepared using Dragon voice recognition software and may include unintentional dictation errors.    Loyd Candida LULLA Aldona, PA-C 10/12/23 0040    Sung, Jade J, MD 10/12/23 [phone]
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
## 2023-10-11 NOTE — ED Provider Notes (Signed)
-----------------------------------------   11:43 PM on 10/11/2023 -----------------------------------------   Assumed care of patient who remains uncomfortable despite Toradol  and Ativan .  Cardiac workup reassuring.  Will administer low-dose IV Droperidol  and reassess.  ----------------------------------------- 00:45 AM on 10/12/2023 -----------------------------------------   Patient rates no relief despite IV ketorolac , Ativan  and droperidol .  Hospitalist services was contacted for evaluation and admission.  He recommended IV morphine  and Neurontin .   Shyla Gayheart J, MD 10/12/23 [phone]

## 2023-10-11 NOTE — ED Provider Triage Note (Signed)
 Emergency Medicine Provider Triage Evaluation Note  Stacy Ross , a 48 y.o. female  was evaluated in triage.  Pt complains of chest pain, radiates to the left arm, patient states  legs pain.  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  Appropriate orders placed.  Talecia Sherlin Cush was informed that the remainder of the evaluation will be completed by another provider, this initial triage assessment does not replace that evaluation, and the importance of remaining in the ED until their evaluation is complete.  Patient with chest pain, chest pain protocol: CBC CMP checks x-ray EKG troponins   Janit Kast, PA-C 10/11/23 1739

## 2023-10-11 NOTE — ED Notes (Signed)
 Patient is being discharged from the Urgent Care and sent to the Emergency Department via POV . Per Kriste Berth, DO , patient is in need of higher level of care due to chest pain. Patient is aware and verbalizes understanding of plan of care.  Vitals:   10/11/23 1636  BP: (!) 180/110  Pulse: 93  Resp: 18  Temp: 98.1 F (36.7 C)  SpO2: 98%

## 2023-10-11 NOTE — ED Triage Notes (Signed)
C?o chest pain today and bilateral arma nd leg pain

## 2023-10-11 NOTE — ED Triage Notes (Signed)
 Patient presents with c/o chest tightness,nausea, and bilateral arm and leg pain x 1 day.

## 2023-10-11 NOTE — Discharge Instructions (Signed)

## 2023-10-12 ENCOUNTER — Observation Stay: Payer: 59

## 2023-10-12 DIAGNOSIS — I160 Hypertensive urgency: Secondary | ICD-10-CM | POA: Diagnosis not present

## 2023-10-12 DIAGNOSIS — M79609 Pain in unspecified limb: Secondary | ICD-10-CM | POA: Diagnosis not present

## 2023-10-12 DIAGNOSIS — R202 Paresthesia of skin: Principal | ICD-10-CM | POA: Diagnosis present

## 2023-10-12 DIAGNOSIS — J4520 Mild intermittent asthma, uncomplicated: Secondary | ICD-10-CM

## 2023-10-12 DIAGNOSIS — J45909 Unspecified asthma, uncomplicated: Secondary | ICD-10-CM

## 2023-10-12 LAB — CBC
HCT: 39.8 % (ref 36.0–46.0)
Hemoglobin: 12.8 g/dL (ref 12.0–15.0)
MCH: 27.8 pg (ref 26.0–34.0)
MCHC: 32.2 g/dL (ref 30.0–36.0)
MCV: 86.3 fL (ref 80.0–100.0)
Platelets: 261 10*3/uL (ref 150–400)
RBC: 4.61 MIL/uL (ref 3.87–5.11)
RDW: 13.8 % (ref 11.5–15.5)
WBC: 5.6 10*3/uL (ref 4.0–10.5)
nRBC: 0 % (ref 0.0–0.2)

## 2023-10-12 LAB — C-REACTIVE PROTEIN: CRP: 0.7 mg/dL (ref <1.0)

## 2023-10-12 LAB — BASIC METABOLIC PANEL
Anion gap: 13 (ref 5–15)
BUN: 11 mg/dL (ref 6–20)
CO2: 19 mmol/L — ABNORMAL LOW (ref 22–32)
Calcium: 8.9 mg/dL (ref 8.9–10.3)
Chloride: 103 mmol/L (ref 98–111)
Creatinine, Ser: 1.03 mg/dL — ABNORMAL HIGH (ref 0.44–1.00)
GFR, Estimated: >60 mL/min (ref >60)
Glucose, Bld: 105 mg/dL — ABNORMAL HIGH (ref 70–99)
Potassium: 4 mmol/L (ref 3.5–5.1)
Sodium: 135 mmol/L (ref 135–145)

## 2023-10-12 LAB — TSH: TSH: 2.214 u[IU]/mL (ref 0.350–4.500)

## 2023-10-12 LAB — SEDIMENTATION RATE: Sed Rate: 1 mm/h (ref 0–20)

## 2023-10-12 MED ORDER — SODIUM CHLORIDE 0.9 % IV SOLN
INTRAVENOUS | Status: DC
Start: 2023-10-12 — End: 2023-10-12

## 2023-10-12 MED ORDER — ALBUTEROL SULFATE (2.5 MG/3ML) 0.083% IN NEBU: 2.5000 mg | INHALATION_SOLUTION | Freq: Four times a day (QID) | RESPIRATORY_TRACT | Status: DC | PRN

## 2023-10-12 MED ORDER — HYDROCODONE-ACETAMINOPHEN 5-325 MG PO TABS
1.0000 | ORAL_TABLET | ORAL | Status: DC | PRN
  Administered 2023-10-12 – 2023-10-13 (×4): 1 via ORAL
  Filled 2023-10-12 (×4): qty 1

## 2023-10-12 MED ORDER — GABAPENTIN 300 MG PO CAPS
300.0000 mg | ORAL_CAPSULE | Freq: Once | ORAL | Status: AC
Start: 2023-10-12 — End: 2023-10-12
  Administered 2023-10-12: 300 mg via ORAL
  Filled 2023-10-12: qty 1

## 2023-10-12 MED ORDER — GADOBUTROL 1 MMOL/ML IV SOLN
10.0000 mL | Freq: Once | INTRAVENOUS | Status: AC | PRN
  Administered 2023-10-12: 10 mL via INTRAVENOUS

## 2023-10-12 MED ORDER — MORPHINE SULFATE (PF) 4 MG/ML IV SOLN
4.0000 mg | Freq: Once | INTRAVENOUS | Status: AC
Start: 2023-10-12 — End: 2023-10-12
  Administered 2023-10-12: 4 mg via INTRAVENOUS
  Filled 2023-10-12: qty 1

## 2023-10-12 MED ORDER — ALBUTEROL SULFATE HFA 108 (90 BASE) MCG/ACT IN AERS: 1.0000 | INHALATION_SPRAY | Freq: Four times a day (QID) | RESPIRATORY_TRACT | Status: DC | PRN

## 2023-10-12 MED ORDER — MORPHINE SULFATE (PF) 2 MG/ML IV SOLN: 2.0000 mg | INTRAVENOUS | Status: DC | PRN

## 2023-10-12 MED ORDER — GABAPENTIN 100 MG PO CAPS
200.0000 mg | ORAL_CAPSULE | Freq: Three times a day (TID) | ORAL | Status: DC
  Administered 2023-10-12 – 2023-10-13 (×3): 200 mg via ORAL
  Filled 2023-10-12 (×3): qty 2

## 2023-10-12 MED ORDER — MORPHINE SULFATE (PF) 2 MG/ML IV SOLN
2.0000 mg | INTRAVENOUS | Status: DC | PRN
  Filled 2023-10-12: qty 1

## 2023-10-12 MED ORDER — HYDRALAZINE HCL 20 MG/ML IJ SOLN: 10.0000 mg | Freq: Four times a day (QID) | INTRAMUSCULAR | Status: DC | PRN

## 2023-10-12 MED ORDER — MORPHINE SULFATE (PF) 2 MG/ML IV SOLN
2.0000 mg | INTRAVENOUS | Status: DC | PRN
  Administered 2023-10-12 (×2): 2 mg via INTRAVENOUS
  Filled 2023-10-12 (×2): qty 1

## 2023-10-12 MED ORDER — TRAMADOL HCL 50 MG PO TABS
50.0000 mg | ORAL_TABLET | Freq: Four times a day (QID) | ORAL | Status: DC | PRN
  Administered 2023-10-12 – 2023-10-13 (×2): 50 mg via ORAL
  Filled 2023-10-12 (×2): qty 1

## 2023-10-12 MED ORDER — MOMETASONE FURO-FORMOTEROL FUM 200-5 MCG/ACT IN AERO
2.0000 | INHALATION_SPRAY | Freq: Two times a day (BID) | RESPIRATORY_TRACT | Status: DC
  Administered 2023-10-13: 2 via RESPIRATORY_TRACT
  Filled 2023-10-12 (×3): qty 8.8

## 2023-10-12 MED ORDER — LORAZEPAM 2 MG/ML IJ SOLN: 1.0000 mg | Freq: Once | INTRAMUSCULAR | Status: DC | PRN

## 2023-10-12 MED ORDER — TRAZODONE HCL 50 MG PO TABS: 25.0000 mg | ORAL_TABLET | Freq: Every evening | ORAL | Status: DC | PRN

## 2023-10-12 MED ORDER — GABAPENTIN 100 MG PO CAPS: 200.0000 mg | ORAL_CAPSULE | Freq: Three times a day (TID) | ORAL | Status: DC

## 2023-10-12 MED ORDER — ONDANSETRON HCL 4 MG PO TABS: 4.0000 mg | ORAL_TABLET | Freq: Four times a day (QID) | ORAL | Status: DC | PRN

## 2023-10-12 MED ORDER — ACETAMINOPHEN 325 MG PO TABS
650.0000 mg | ORAL_TABLET | Freq: Four times a day (QID) | ORAL | Status: DC | PRN
  Administered 2023-10-12 (×2): 650 mg via ORAL
  Filled 2023-10-12 (×2): qty 2

## 2023-10-12 MED ORDER — ENOXAPARIN SODIUM 60 MG/0.6ML IJ SOSY
50.0000 mg | PREFILLED_SYRINGE | INTRAMUSCULAR | Status: DC
  Administered 2023-10-13: 50 mg via SUBCUTANEOUS
  Filled 2023-10-12 (×2): qty 0.6

## 2023-10-12 MED ORDER — MAGNESIUM HYDROXIDE 400 MG/5ML PO SUSP: 30.0000 mL | Freq: Every day | ORAL | Status: DC | PRN

## 2023-10-12 MED ORDER — LORATADINE 10 MG PO TABS
10.0000 mg | ORAL_TABLET | Freq: Every day | ORAL | Status: DC
  Administered 2023-10-12 – 2023-10-13 (×2): 10 mg via ORAL
  Filled 2023-10-12 (×2): qty 1

## 2023-10-12 MED ORDER — AMLODIPINE BESYLATE 10 MG PO TABS
10.0000 mg | ORAL_TABLET | Freq: Every day | ORAL | Status: DC
  Administered 2023-10-13: 10 mg via ORAL
  Filled 2023-10-12: qty 1
  Filled 2023-10-12: qty 2

## 2023-10-12 MED ORDER — LABETALOL HCL 5 MG/ML IV SOLN: 20.0000 mg | INTRAVENOUS | Status: DC | PRN

## 2023-10-12 MED ORDER — ONDANSETRON HCL 4 MG/2ML IJ SOLN
4.0000 mg | Freq: Four times a day (QID) | INTRAMUSCULAR | Status: DC | PRN
  Administered 2023-10-12: 4 mg via INTRAVENOUS
  Filled 2023-10-12: qty 2

## 2023-10-12 MED ORDER — KETOROLAC TROMETHAMINE 15 MG/ML IJ SOLN
15.0000 mg | Freq: Four times a day (QID) | INTRAMUSCULAR | Status: DC | PRN
  Administered 2023-10-12: 15 mg via INTRAVENOUS
  Filled 2023-10-12: qty 1

## 2023-10-12 MED ORDER — ACETAMINOPHEN 650 MG RE SUPP: 650.0000 mg | Freq: Four times a day (QID) | RECTAL | Status: DC | PRN

## 2023-10-12 NOTE — Assessment & Plan Note (Signed)
-   We will continue her inhalers. 

## 2023-10-12 NOTE — ED Notes (Signed)
 After rounding on this pt, this RN was informed that the pain of the pt has not been improved. Pt does not feel as though the pain medications have touched the pain.

## 2023-10-12 NOTE — Progress Notes (Signed)
 PHARMACIST - PHYSICIAN COMMUNICATION  CONCERNING:  Enoxaparin  (Lovenox ) for DVT Prophylaxis    RECOMMENDATION: Patient was prescribed enoxaprin 40mg  q24 hours for VTE prophylaxis.   Filed Weights   10/11/23 1733  Weight: 98.4 kg (216 lb 14.9 oz)    Body mass index is 42.37 kg/m.  Estimated Creatinine Clearance: 71.9 mL/min (A) (by C-G formula based on SCr of 1.04 mg/dL (H)).   Based on [facility] policy patient is candidate for enoxaparin  0.5mg /kg TBW SQ every 24 hours based on BMI being >30.  DESCRIPTION: Pharmacy has adjusted enoxaparin  dose per [facility] policy.  Patient is now receiving enoxaparin  0.5 mg/kg every 24 hours   Rankin CANDIE Dills, PharmD, [facility] 10/12/2023 12:47 AM

## 2023-10-12 NOTE — Progress Notes (Signed)
 Brief rounding note, same day as admission  HPI: Pt admitted after midnight for evaluation and management of bilateral upper and lower extremity pain and paresthesias. See H&P for full HPI on admission & ED course.   Interval history: Pt seen in ED holding for a bed today.  She reports improved pain control with meds being given.  No known history of diabetes but reports she's been on courses of prednisone  and knows it increases sugars. Reports ~40 lb weight gain from needing steroids.  Does not feel it's likely she'd have vitamin deficiencies.  Denies significant neck pain or past issues with her neck.  She describes pain as variable sometimes pins and needles, sometimes burning hot and sometimes achy and cramping in nature.   Exam: General exam: awake, alert, no acute distress, obese HEENT: atraumatic, clear conjunctiva, anicteric sclera, moist mucus membranes, hearing grossly normal  Respiratory system: CTA, no wheezes, rales or rhonchi, normal respiratory effort. Cardiovascular system: normal S1/S2, RRR,  no pedal edema.   Gastrointestinal system: soft, NT, ND, no HSM felt, +bowel sounds. Central nervous system: A&O x 4. normal speech.  Subjective difference in touch sensation at bilateral ankles with reduced sensation medially on both sides.  Extremities: moves all , no edema, normal tone Skin: dry, intact, normal temperature, normal color, No rashes, lesions or ulcers Psychiatry: normal mood, congruent affect, judgement and insight appear normal   A&P: as per H&P by Dr. Lawence, with any changes or additions as below:   Broad differential diagnosis discussed with patient including --Cervical myelopathy versus less likely radiculopathy given bilateral involvement of both upper and lower extremities. --Autoimmune cause --Vitamin deficiencies --Thyroid  abnormalities   Additional work up for potential etiology ordered --MRI cervical spine --Additional labs -- TSH, vitamin B12,  ESR --Follow pending CRP and RF  Meds: --Start scheduled gabapentin  trial at 200 mg TID and titrate dose as tolerated --D/C scheduled IV ativan  1 mg q4h --Will treat anxiety as needed, not standing scheduled benzo --Otherwise continue pain control with PO tramadol  or Norco, IV toradol , IV morphine  only for breakthrough pain   Outpatient Neurology referral submitted to [facility] group.    Time spent: 42 minutes

## 2023-10-12 NOTE — Evaluation (Signed)
 Physical Therapy Evaluation Patient Details Name: Stacy Ross MRN: [id number] DOB: 04/24/1976 Today's Date: 10/12/2023  History of Present Illness  48 y.o. Caucasian female with medical history significant for anxiety, asthma and ADD, who presented to the emergency room with severe bilateral hand and feet pain with inability to ambulate secondarily.  She stated that she has been having bilateral hand and feet tingling and numbness followed by achiness.  Due to evolution of symptoms including chest pain pt sent to ED.  Clinical Impression  Pt pleasant but endorses not feeling fully awake due to pain meds.  Never the less she was very willing to get up and work with PT and was able to ambulate w/o AD at community appropriate speed w/o issue.  She endorses relatively mild global pain (b/l U&LEs) but pain and numbness were persistent.  Pt overall did well with mobility and showed ability to safely go home but still having acute neurological-seeming symptoms.  Will benefit from continued PT intervention to address these issues.        If plan is discharge home, recommend the following: Help with stairs or ramp for entrance;Assistance with cooking/housework   Can travel by private vehicle        Equipment Recommendations None recommended by PT  Recommendations for Other Services       Functional Status Assessment Patient has had a recent decline in their functional status and demonstrates the ability to make significant improvements in function in a reasonable and predictable amount of time.     Precautions / Restrictions Precautions Precautions:  (low fall risk) Restrictions Weight Bearing Restrictions Per Provider Order: No      Mobility  Bed Mobility Overal bed mobility: Modified Independent                  Transfers Overall transfer level: Modified independent                 General transfer comment: easily able to rise to standing w/o issue     Ambulation/Gait Ambulation/Gait assistance: Modified independent (Device/Increase time) Gait Distance (Feet): 200 Feet Assistive device: None         General Gait Details: Pt able to ambulate with easy confidence and consistent cadence.  Manages speed and direction changes w/o issue, no increased pain or symptoms with activity  Stairs            Wheelchair Mobility     Tilt Bed    Modified Rankin (Stroke Patients Only)       Balance Overall balance assessment: Independent                                           Pertinent Vitals/Pain Pain Assessment Pain Assessment: 0-10 Pain Score: 3  Pain Location: reports she just got pain meds so is feeling better, unable to dial in where/what of pain very well; variously c/o pain in B/L hands&arms, L shin and b/l feet.  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  Pt reports generally not feeling that strenth/control is at her normal, reports that numbness is minimal/has improved)    Lower Extremity Assessment Lower Extremity Assessment: Generalized weakness       Communication   Communication Communication: No apparent difficulties  Cognition Arousal: Alert (able to overcome pain med lethargy but admits she is a little off) Behavior During Therapy: WFL for tasks assessed/performed Overall  Cognitive Status: Within Functional Limits for tasks assessed                                          General Comments General comments (skin integrity, edema, etc.): Pt with good functional mobility.  She displays some general U&LE weakness that seemed equal b/l and not particularly distal or proximal in nature.    Exercises     Assessment/Plan    PT Assessment Patient needs continued PT services  PT Problem List Decreased strength;Decreased activity tolerance;Pain;Decreased mobility;Decreased range of motion;Decreased coordination       PT Treatment Interventions Gait training;Stair training;Therapeutic exercise;Therapeutic activities;Balance training;Neuromuscular re-education;Patient/family education;Functional mobility training    PT Goals (Current goals can be found in the Care Plan section)  Acute Rehab PT Goals Patient Stated Goal: figure out why she is hurting PT Goal Formulation: With patient Time For Goal Achievement: 10/25/23 Potential to Achieve Goals: Good    Frequency Min 1X/week     Co-evaluation               AM-PAC PT 6 Clicks Mobility  Outcome Measure Help needed turning from your back to your side while in a flat bed without using bedrails?: None Help needed moving from lying on your back to sitting on the side of a flat bed without using bedrails?: None Help needed moving to and from a bed to a chair (including a wheelchair)?: None Help needed standing up from a chair using your arms (e.g., wheelchair or bedside chair)?: None Help needed to walk in hospital room?: None Help needed climbing 3-5 steps with a railing? : A Little 6 Click Score: 23    End of Session   Activity Tolerance: Patient tolerated treatment well Patient left: in bed;with call bell/phone within reach Nurse Communication: Mobility status PT Visit Diagnosis: Muscle weakness (generalized) (M62.81);Difficulty in walking, not elsewhere classified (R26.2)     Time: 9089-9070 PT Time Calculation (min) (ACUTE ONLY): 19 min   Charges:   PT Evaluation $PT Eval Low Complexity: 1 Low   PT General Charges $$ ACUTE PT VISIT: 1 Visit         Carmin JONELLE Deed, DPT 10/12/2023, 10:49 AM

## 2023-10-12 NOTE — TOC CM/SW Note (Signed)
 CSW attempted to speak to pt to discuss OPT PT, but pt unavailable. CSW will attempt again.

## 2023-10-12 NOTE — ED Notes (Signed)
 Pt ate breakfast.

## 2023-10-12 NOTE — ED Notes (Signed)
 Pt updated on plan of care- per Dr. Antoniette Batty informing this RN.

## 2023-10-12 NOTE — Assessment & Plan Note (Signed)
-   The patient will be placed on p.o. Norvasc . - Will add as needed IV labetalol  and hydralazine  for optimal BP control. - Pain management should help as well.

## 2023-10-12 NOTE — Assessment & Plan Note (Addendum)
-   This involves both hands and feet with associated paresthesias and numbness. - The patient be admitted to an observation medical bed. - We will place her on p.o. Neurontin  that was started in the ER. - She will be placed on as needed IV Toradol  and morphine  sulfate. - Will obtain a sed rate, CRP and rheumatoid factor. -Due to inability to ambulate we will obtain physical therapy consult.
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
## 2023-10-12 NOTE — ED Notes (Signed)
 Pt at MRI

## 2023-10-13 ENCOUNTER — Encounter: Payer: Self-pay | Admitting: Family Medicine

## 2023-10-13 DIAGNOSIS — R7303 Prediabetes: Secondary | ICD-10-CM | POA: Diagnosis present

## 2023-10-13 DIAGNOSIS — R202 Paresthesia of skin: Secondary | ICD-10-CM | POA: Diagnosis not present

## 2023-10-13 DIAGNOSIS — M79609 Pain in unspecified limb: Secondary | ICD-10-CM | POA: Diagnosis not present

## 2023-10-13 LAB — HEMOGLOBIN A1C
Hgb A1c MFr Bld: 5.3 % (ref 4.8–5.6)
Mean Plasma Glucose: 105.41 mg/dL

## 2023-10-13 MED ORDER — GABAPENTIN 100 MG PO CAPS: 200.0000 mg | ORAL_CAPSULE | Freq: Three times a day (TID) | ORAL | 0 refills | Status: DC

## 2023-10-13 MED ORDER — AMLODIPINE BESYLATE 10 MG PO TABS: 10.0000 mg | ORAL_TABLET | Freq: Every day | ORAL | 1 refills | Status: DC

## 2023-10-13 MED ORDER — METHOCARBAMOL 500 MG PO TABS: 500.0000 mg | ORAL_TABLET | Freq: Three times a day (TID) | ORAL | 0 refills | Status: DC | PRN

## 2023-10-13 MED ORDER — METHOCARBAMOL 500 MG PO TABS
500.0000 mg | ORAL_TABLET | Freq: Three times a day (TID) | ORAL | Status: DC
  Administered 2023-10-13: 500 mg via ORAL
  Filled 2023-10-13: qty 1

## 2023-10-13 NOTE — Plan of Care (Signed)

## 2023-10-13 NOTE — TOC CM/SW Note (Signed)
     [facility] REFERRAL        Occupational Therapy * Physical Therapy * Speech Therapy                           DATE 10/13/2023 PATIENT NAME Stacy Ross   PATIENT MRN [id number]        DIAGNOSIS/DIAGNOSIS CODE  Extremity pain  DATE OF DISCHARGE: 10/13/2023       PRIMARY CARE PHYSICIAN   Cathryne Molt   PCP PHONE/FAX      Dear Provider (Name: [facility] outpatient __  Fax: [phone]   I certify that I have examined this patient and that occupational/physical/speech therapy is necessary on an outpatient basis.    The patient has expressed interest in completing their recommended course of therapy at your  location.  Once a formal order from the patient's primary care physician has been obtained, please  contact him/her to schedule an appointment for evaluation at your earliest convenience.   [x ]  Physical Therapy Evaluate and Treat  [ x ]  Occupational Therapy Evaluate and Treat  [  ]  Speech Therapy Evaluate and Treat         The patient's primary care physician (listed above) must furnish and be responsible for a formal order such that the recommended services may be furnished while under the primary physician's care, and that the plan of care will be established and reviewed every 30 days (or more often if condition necessitates).

## 2023-10-13 NOTE — Plan of Care (Signed)
  Problem: Education: Goal: Knowledge of General Education information will improve Description: Including pain rating scale, medication(s)/side effects and non-pharmacologic comfort measures Outcome: Completed/Met   Problem: Health Behavior/Discharge Planning: Goal: Ability to manage health-related needs will improve Outcome: Completed/Met   Problem: Clinical Measurements: Goal: Ability to maintain clinical measurements within normal limits will improve Outcome: Completed/Met Goal: Will remain free from infection Outcome: Completed/Met Goal: Diagnostic test results will improve Outcome: Completed/Met Goal: Respiratory complications will improve Outcome: Completed/Met Goal: Cardiovascular complication will be avoided Outcome: Completed/Met   Problem: Clinical Measurements: Goal: Will remain free from infection Outcome: Completed/Met

## 2023-10-13 NOTE — TOC Transition Note (Signed)
 Transition of Care [facility]) - Discharge Note   Patient Details  Name: Stacy Ross MRN: [id number] Date of Birth: Jan 30, 1976  Transition [facility]) CM/SW Contact:  Ladene Lady, LCSW Phone Number: 10/13/2023, 10:16 AM   Clinical Narrative: Pt discharging home with outpatient PT.      Final next level of care: OP Rehab Barriers to Discharge: Barriers Resolved   Patient Goals and CMS Choice     Choice offered to / list presented to : Patient      Discharge Placement                    Patient and family notified of of transfer: 10/13/23  Discharge Plan and Services Additional resources added to the After Visit Summary for                                       Social Drivers of Health (SDOH) Interventions SDOH Screenings   Food Insecurity: No Food Insecurity (10/13/2023)  Housing: Low Risk  (10/13/2023)  Transportation Needs: No Transportation Needs (10/13/2023)  Utilities: Not At Risk (10/13/2023)  Depression (PHQ2-9): Medium Risk (10/03/2023)  Social Connections: Unknown (03/21/2022)   Received from [facility] [place], [facility]  Tobacco Use: Medium Risk (10/13/2023)     Readmission Risk Interventions     No data to display

## 2023-10-13 NOTE — Discharge Summary (Signed)
 Physician Discharge Summary   Patient: Stacy Ross MRN: [id number] DOB: 1976-02-13  Admit date:     10/11/2023  Discharge date: 10/13/2023  Discharge Physician: Burnard DELENA Cunning   PCP: Joshua Cathryne BROCKS, MD   Recommendations at discharge:   Follow up with Primary Care Follow up pending Hb-A1c, B12 level, RF Follow up on BP control. Pt was started on amlodipine  for hypertension. Follow up on efficacy and if any side effects with gabapentin  started for peripheral neuropathy  Discharge Diagnoses: Principal Problem:   Extremity pain Active Problems:   Hypertensive urgency   Asthma, chronic   Paresthesias   Prediabetes  Resolved Problems:   * No resolved hospital problems. [facility] Course:  Pt admitted for evaluation and management of bilateral upper and lower extremity pain and paresthesias.  See H&P for full HPI on admission & ED course.  Evaluation as outlined below was unremarkable. Patient's neuropathic pain symptoms improved with gabapentin . Patient having back pain and tightness after spending all day and night on ED stretcher, treating with muscle relaxant and recommend pt mobilize at home.  Pt is clinically improved, inpatient evaluation completed and pt medically stable and agreeable to discharge home today with close PCP folllow up  Assessment and Plan:  Extremity pain of bilateral upper and lower extremities in stocking/glove distribution MRI C-spine unremarkable - ruled out cervical myelopathy or radiculopathy. Labs so far unremarkable including ESR, CRP, TSH PENDING: Hbg-A1c, B12, B1, RF --Continue gabapentin  200 mg TID and titrate dose as tolerated at follow up --PT recommended outpatient PT --Robaxin  PRN, Tylenol  and/or ibuprofen PRN for back pain/tightness from immobility while in hospital  Hypertensive urgency - Started on Norvasc . 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 --Repeat A1c pending       Consultants: None Procedures performed: None  Disposition: Home Diet recommendation:  Regular diet DISCHARGE MEDICATION: Allergies as of 10/13/2023   No Known Allergies      Medication List     TAKE these medications    albuterol  108 (90 Base) MCG/ACT inhaler Commonly known as: VENTOLIN  HFA Inhale 1-2 puffs into the lungs every 6 (six) hours as needed for wheezing or shortness of breath.   amLODipine  10 MG tablet Commonly known as: NORVASC  Take 1 tablet (10 mg total) by mouth daily. Start taking on: October 14, 2023   budesonide-formoterol  160-4.5 MCG/ACT inhaler Commonly known as: SYMBICORT Inhale into the lungs. fleming   cetirizine  10 MG tablet Commonly known as: ZYRTEC  Take by mouth.   gabapentin  100 MG capsule Commonly known as: NEURONTIN  Take 2 capsules (200 mg total) by mouth 3 (three) times daily.   methocarbamol  500 MG tablet Commonly known as: ROBAXIN  Take 1 tablet (500 mg total) by mouth every 8 (eight) hours as needed for muscle spasms.   ofloxacin  0.3 % OTIC solution Commonly known as: FLOXIN  Place 3 drops into the left ear 2 (two) times daily.   omeprazole 20 MG capsule Commonly known as: PRILOSEC Take 20 mg by mouth daily.   Ryaltris 334-74 MCG/ACT Susp Generic drug: Olopatadine-Mometasone  Place 1 spray into the nose 2 (two) times daily.   sulfamethoxazole -trimethoprim  800-160 MG tablet Commonly known as: BACTRIM  DS Take 1 tablet by mouth 2 (  two) times daily. X 10 days (starting 10/03/23)        Discharge Exam: Filed Weights   10/11/23 1733 10/12/23 2047  Weight: 98.4 kg 97 kg   General exam: awake, alert, no acute distress HEENT: moist mucus membranes, hearing grossly normal  Respiratory system: on room air, normal  respiratory effort. Cardiovascular system: RRR, no pedal edema.   Central nervous system: A&O x 3. no gross focal neurologic deficits, normal speech Extremities: moves all , no edema, normal tone Psychiatry: normal mood, congruent affect, judgement and insight appear normal   Condition at discharge: stable  The results of significant diagnostics from this hospitalization (including imaging, microbiology, ancillary and laboratory) are listed below for reference.   Imaging Studies: MR CERVICAL SPINE W WO CONTRAST Result Date: 10/12/2023 CLINICAL DATA:  Myelopathy, acute, cervical spine EXAM: MRI CERVICAL SPINE WITHOUT AND WITH CONTRAST TECHNIQUE: Multiplanar and multiecho pulse sequences of the cervical spine, to include the craniocervical junction and cervicothoracic junction, were obtained without and with intravenous contrast. CONTRAST:  10mL GADAVIST  GADOBUTROL  1 MMOL/ML IV SOLN COMPARISON:  None Available. FINDINGS: Alignment: Physiologic. Vertebrae: No fracture, evidence of discitis, or bone lesion. Cord: Normal signal and morphology. Posterior Fossa, vertebral arteries, paraspinal tissues: Negative Disc levels: C1-C2: Mild bilateral degenerative change. C2-C3: Unremarkable C3-C4: Unremarkable C4-C5: Unremarkable C5-C6: Unremarkable C6-C7: Minimal disc bulge. No significant spinal canal narrowing. Mild left facet degenerative change. No neural foraminal narrowing. C7-T1: Unremarkable IMPRESSION: No significant spinal canal or neural foraminal narrowing. No cord signal abnormality. Electronically Signed   By: Lyndall Gore M.D.   On: 10/12/2023 11:00   DG Chest 2 View Result Date: 10/11/2023 CLINICAL DATA:  Chest pain radiating to the left arm EXAM: CHEST - 2 VIEW COMPARISON:  09/12/2023 FINDINGS: The heart size and mediastinal contours are within normal limits. Both lungs are clear. The visualized skeletal structures are unremarkable. IMPRESSION: No active cardiopulmonary disease. Electronically  Signed   By: Norman Gatlin M.D.   On: 10/11/2023 18:34    Microbiology: Results for orders placed or performed during the hospital encounter of 10/11/23  Resp panel by RT-PCR (RSV, Flu A&B, Covid) Anterior Nasal Swab     Status: None   Collection Time: 10/11/23 10:00 PM   Specimen: Anterior Nasal Swab  Result Value Ref Range Status   SARS Coronavirus 2 by RT PCR NEGATIVE NEGATIVE Final    Comment: (NOTE) SARS-CoV-2 target nucleic acids are NOT DETECTED.  The SARS-CoV-2 RNA is generally detectable in upper respiratory specimens during the acute phase of infection. The lowest concentration of SARS-CoV-2 viral copies this assay can detect is 138 copies/mL. A negative result does not preclude SARS-Cov-2 infection and should not be used as the sole basis for treatment or other patient management decisions. A negative result may occur with  improper specimen collection/handling, submission of specimen other than nasopharyngeal swab, presence of viral mutation(s) within the areas targeted by this assay, and inadequate number of viral copies(<138 copies/mL). A negative result must be combined with clinical observations, patient history, and epidemiological information. The expected result is Negative.  Fact Sheet for Patients:  bloggercourse.com  Fact Sheet for Healthcare Providers:  seriousbroker.it  This test is no t yet approved or cleared by the [place]  FDA and  has been authorized for detection and/or diagnosis of SARS-CoV-2 by FDA under an Emergency Use Authorization (EUA). This EUA will remain  in effect (meaning this test can be used) for the duration of the COVID-19 declaration under Section 564(b)(1) of  the Act, 21 U.S.C.section 360bbb-3(b)(1), unless the authorization is terminated  or revoked sooner.       Influenza A by PCR NEGATIVE NEGATIVE Final   Influenza B by PCR NEGATIVE NEGATIVE Final    Comment:  (NOTE) The Xpert Xpress SARS-CoV-2/FLU/RSV plus assay is intended as an aid in the diagnosis of influenza from Nasopharyngeal swab specimens and should not be used as a sole basis for treatment. Nasal washings and aspirates are unacceptable for Xpert Xpress SARS-CoV-2/FLU/RSV testing.  Fact Sheet for Patients: bloggercourse.com  Fact Sheet for Healthcare Providers: seriousbroker.it  This test is not yet approved or cleared by the [place]  FDA and has been authorized for detection and/or diagnosis of SARS-CoV-2 by FDA under an Emergency Use Authorization (EUA). This EUA will remain in effect (meaning this test can be used) for the duration of the COVID-19 declaration under Section 564(b)(1) of the Act, 21 U.S.C. section 360bbb-3(b)(1), unless the authorization is terminated or revoked.     Resp Syncytial Virus by PCR NEGATIVE NEGATIVE Final    Comment: (NOTE) Fact Sheet for Patients: bloggercourse.com  Fact Sheet for Healthcare Providers: seriousbroker.it  This test is not yet approved or cleared by the [place]  FDA and has been authorized for detection and/or diagnosis of SARS-CoV-2 by FDA under an Emergency Use Authorization (EUA). This EUA will remain in effect (meaning this test can be used) for the duration of the COVID-19 declaration under Section 564(b)(1) of the Act, 21 U.S.C. section 360bbb-3(b)(1), unless the authorization is terminated or revoked.  Performed at [facility], [address] [place], [place] [address]     Labs: CBC: Recent Labs  Lab 10/11/23 1743 10/12/23 0500  WBC 5.3 5.6  HGB 13.1 12.8  HCT 39.9 39.8  MCV 83.5 86.3  PLT 337 261   Basic Metabolic Panel: Recent Labs  Lab 10/11/23 1743 10/12/23 0500  NA 136 135  K 3.9 4.0  CL 103 103  CO2 22 19*  GLUCOSE 101* 105*  BUN 14 11  CREATININE 1.04* 1.03*  CALCIUM  9.1 8.9   Liver Function Tests: No results for input(s): AST, ALT, ALKPHOS, BILITOT, PROT, ALBUMIN in the last 168 hours. CBG: No results for input(s): GLUCAP in the last 168 hours.  Discharge time spent: greater than 30 minutes.  Signed: Burnard DELENA Cunning, DO [facility] 10/13/2023

## 2023-10-13 NOTE — Progress Notes (Signed)
 Patient is alert and oriented X 4. Discharge instruction given. Peripheral I/v removed. No any questions or concern at this time.

## 2023-10-13 NOTE — TOC Progression Note (Signed)
 Transition of Care [facility]) - Progression Note    Patient Details  Name: Stacy Ross MRN: [id number] Date of Birth: 1976-08-09  [facility]) CM/SW Contact  Ladene Lady, LCSW Phone Number: 10/13/2023, 10:06 AM  Clinical Narrative:   CSW spoke with patient about outpatient pt. She would like to go to [facility] outpatient.         Expected Discharge Plan and Services         Expected Discharge Date: 10/13/23                                     Social Determinants of Health (SDOH) Interventions SDOH Screenings   Food Insecurity: No Food Insecurity (10/13/2023)  Housing: Low Risk  (10/13/2023)  Transportation Needs: No Transportation Needs (10/13/2023)  Utilities: Not At Risk (10/13/2023)  Depression (PHQ2-9): Medium Risk (10/03/2023)  Social Connections: Unknown (03/21/2022)   Received from [facility]  Tobacco Use: Medium Risk (10/13/2023)    Readmission Risk Interventions     No data to display

## 2023-10-13 NOTE — Assessment & Plan Note (Signed)
 Hbg A1c two years ago was 5.8% --Repeat A1c pending

## 2023-10-14 ENCOUNTER — Encounter: Payer: Self-pay | Admitting: Family Medicine

## 2023-10-14 ENCOUNTER — Encounter: Payer: Self-pay | Admitting: Physician Assistant

## 2023-10-15 LAB — RHEUMATOID FACTOR: Rheumatoid fact SerPl-aCnc: 22.8 [IU]/mL — ABNORMAL HIGH (ref <14.0)

## 2023-10-16 ENCOUNTER — Encounter: Payer: Self-pay | Admitting: Family Medicine

## 2023-10-16 ENCOUNTER — Ambulatory Visit: Payer: 59 | Admitting: Family Medicine

## 2023-10-16 ENCOUNTER — Telehealth: Payer: Self-pay | Admitting: Family Medicine

## 2023-10-16 VITALS — BP 132/80 | HR 74

## 2023-10-16 DIAGNOSIS — G629 Polyneuropathy, unspecified: Secondary | ICD-10-CM | POA: Diagnosis not present

## 2023-10-16 DIAGNOSIS — M5116 Intervertebral disc disorders with radiculopathy, lumbar region: Secondary | ICD-10-CM | POA: Diagnosis not present

## 2023-10-16 DIAGNOSIS — M7918 Myalgia, other site: Secondary | ICD-10-CM | POA: Diagnosis not present

## 2023-10-16 LAB — VITAMIN B1: Vitamin B1 (Thiamine): 114.2 nmol/L (ref 66.5–200.0)

## 2023-10-16 MED ORDER — PREDNISONE 10 MG PO TABS: ORAL_TABLET | ORAL | 0 refills | Status: DC

## 2023-10-16 NOTE — Progress Notes (Signed)
 Date:  10/16/2023   Name:  Stacy Ross   DOB:  05-25-1976   MRN:  [id number]   Chief Complaint: Back Pain  Back Pain This is a new problem. The current episode started in the past 7 days. The problem occurs constantly. The problem has been waxing and waning since onset. The pain is present in the lumbar spine. The quality of the pain is described as aching. The pain radiates to the left foot, left thigh, right knee, right thigh and left knee. The pain is moderate. Associated symptoms include paresthesias and tingling. Pertinent negatives include no abdominal pain, chest pain, dysuria, fever, numbness, paresis or weakness. She has tried NSAIDs for the symptoms. The treatment provided mild relief.    Lab Results  Component Value Date   NA 135 10/12/2023   K 4.0 10/12/2023   CO2 19 (L) 10/12/2023   GLUCOSE 105 (H) 10/12/2023   BUN 11 10/12/2023   CREATININE 1.03 (H) 10/12/2023   CALCIUM 8.9 10/12/2023   EGFR 90 08/01/2023   GFRNONAA >60 10/12/2023   Lab Results  Component Value Date   CHOL 182 08/01/2023   HDL 44 08/01/2023   LDLCALC 114 (H) 08/01/2023   TRIG 134 08/01/2023   Lab Results  Component Value Date   TSH 2.214 10/12/2023   Lab Results  Component Value Date   HGBA1C 5.3 10/13/2023   Lab Results  Component Value Date   WBC 5.6 10/12/2023   HGB 12.8 10/12/2023   HCT 39.8 10/12/2023   MCV 86.3 10/12/2023   PLT 261 10/12/2023   Lab Results  Component Value Date   ALT 15 08/01/2023   AST 16 08/01/2023   ALKPHOS 81 08/01/2023   BILITOT 0.5 08/01/2023   No results found for: "25OHVITD2", "25OHVITD3", "VD25OH"   Review of Systems  Constitutional:  Negative for chills, fatigue and fever.  Respiratory:  Negative for cough, choking, chest tightness, shortness of breath and wheezing.   Cardiovascular:  Negative for chest pain and palpitations.  Gastrointestinal:  Negative for abdominal pain.  Endocrine: Negative for polydipsia and polyuria.  Genitourinary:   Negative for decreased urine volume, difficulty urinating and dysuria.  Musculoskeletal:  Positive for back pain, myalgias and neck pain. Negative for arthralgias, gait problem, joint swelling and neck stiffness.  Skin:  Positive for color change. Negative for rash.  Neurological:  Positive for tingling and paresthesias. Negative for weakness and numbness.    Patient Active Problem List   Diagnosis Date Noted   Prediabetes 10/13/2023   Extremity pain 10/12/2023   Asthma, chronic 10/12/2023   Hypertensive urgency 10/12/2023   Paresthesias 10/12/2023   Adult attention deficit hyperactivity disorder 08/02/2021   Allergic rhinitis due to pollen 08/02/2021   Secondary amenorrhea 08/02/2021   Eczema 08/02/2021   Menorrhagia 08/02/2021   Vitamin D deficiency 08/02/2021   Chronic fatigue 08/02/2021    No Known Allergies  Past Surgical History:  Procedure Laterality Date   right hand repair N/A     Social History   Tobacco Use   Smoking status: Former    Current packs/day: 0.00    Average packs/day: 0.3 packs/day for 10.0 years (2.5 ttl pk-yrs)    Types: Cigarettes    Start date: 03/06/2011    Quit date: 03/05/2021    Years since quitting: 2.6   Smokeless tobacco: Never  Vaping Use   Vaping status: Never Used  Substance Use Topics   Alcohol use: Yes    Comment: social  Drug use: No     Medication list has been reviewed and updated.  Current Meds  Medication Sig   albuterol  (VENTOLIN  HFA) 108 (90 Base) MCG/ACT inhaler Inhale 1-2 puffs into the lungs every 6 (six) hours as needed for wheezing or shortness of breath.   amLODipine  (NORVASC ) 10 MG tablet Take 1 tablet (10 mg total) by mouth daily.   cetirizine  (ZYRTEC ) 10 MG tablet Take by mouth.   gabapentin  (NEURONTIN ) 100 MG capsule Take 2 capsules (200 mg total) by mouth 3 (three) times daily.   methocarbamol  (ROBAXIN ) 500 MG tablet Take 1 tablet (500 mg total) by mouth every 8 (eight) hours as needed for muscle spasms.    ofloxacin  (FLOXIN ) 0.3 % OTIC solution Place 3 drops into the left ear 2 (two) times daily.   Olopatadine-Mometasone  (RYALTRIS) 665-25 MCG/ACT SUSP Place 1 spray into the nose 2 (two) times daily.   omeprazole (PRILOSEC) 20 MG capsule Take 20 mg by mouth daily.   sulfamethoxazole -trimethoprim  (BACTRIM  DS) 800-160 MG tablet Take 1 tablet by mouth 2 (two) times daily. X 10 days (starting 10/03/23)       10/03/2023    3:35 PM 09/12/2023    4:46 PM 09/12/2023    4:19 PM 08/01/2023    8:32 AM  GAD 7 : Generalized Anxiety Score  Nervous, Anxious, on Edge 0 0 0 0  Control/stop worrying 0 0 0 0  Worry too much - different things 0 0 0 0  Trouble relaxing 0 2 0 0  Restless 1 1 0 1  Easily annoyed or irritable 2 0 0 1  Afraid - awful might happen 0 0 0 0  Total GAD 7 Score 3 3 0 2  Anxiety Difficulty Not difficult at all Not difficult at all Not difficult at all Somewhat difficult       10/03/2023    3:35 PM 09/14/2023   10:01 AM 09/14/2023   10:00 AM  Depression screen PHQ 2/9  Decreased Interest 0 0 0  Down, Depressed, Hopeless 0 0 0  PHQ - 2 Score 0 0 0  Altered sleeping 0 0 2  Tired, decreased energy 0 0 2  Change in appetite 1 0 2  Feeling bad or failure about yourself  0 0 0  Trouble concentrating 2 1 2   Moving slowly or fidgety/restless 2 0 2  Suicidal thoughts 0 0 0  PHQ-9 Score 5 1 10   Difficult doing work/chores Not difficult at all Not difficult at all Not difficult at all    BP Readings from Last 3 Encounters:  10/16/23 132/80  10/13/23 122/77  10/11/23 (!) 180/110    Physical Exam Vitals and nursing note reviewed.  Constitutional:      General: She is not in acute distress.    Appearance: She is not diaphoretic.  HENT:     Head: Normocephalic and atraumatic.     Right Ear: External ear normal.     Left Ear: External ear normal.     Nose: Nose normal.  Eyes:     General:        Right eye: No discharge.        Left eye: No discharge.     Conjunctiva/sclera:  Conjunctivae normal.     Pupils: Pupils are equal, round, and reactive to light.  Neck:     Thyroid : No thyromegaly.     Vascular: No JVD.  Cardiovascular:     Rate and Rhythm: Normal rate and regular rhythm.  Heart sounds: Normal heart sounds. No murmur heard.    No friction rub. No gallop.  Pulmonary:     Effort: Pulmonary effort is normal.     Breath sounds: Normal breath sounds. No wheezing, rhonchi or rales.  Chest:     Chest wall: No tenderness.  Abdominal:     General: Bowel sounds are normal.     Palpations: Abdomen is soft. There is no mass.     Tenderness: There is no abdominal tenderness. There is no guarding.  Musculoskeletal:     Cervical back: Normal range of motion and neck supple. No tenderness.     Lumbar back: Spasms present. No swelling, edema, deformity, signs of trauma, lacerations, tenderness or bony tenderness. Normal range of motion. Positive left straight leg raise test. Negative right straight leg raise test. No scoliosis.  Lymphadenopathy:     Cervical: No cervical adenopathy.  Skin:    General: Skin is warm and dry.  Neurological:     Mental Status: She is alert.     Deep Tendon Reflexes: Reflexes are normal and symmetric.     Wt Readings from Last 3 Encounters:  10/12/23 213 lb 13.5 oz (97 kg)  10/03/23 217 lb (98.4 kg)  09/14/23 217 lb (98.4 kg)    BP 132/80   Pulse 74   LMP 12/04/2020   SpO2 98%   Assessment and Plan: 1. Neuropathy (Primary) New onset and that patient presented to the ER with overall myalgias even with claw deformity and muscle tenderness.  Review of labs noted that the thiamine  was normal CBC was normal sed rate was normal there was an elevation of the rheumatoid factor thiamine  was in normal range and we are still awaiting B12.  MRI was done of the lower back and there was a small disc that was noted and patient is still having some lumbar pain radiating to the thighs bilateral without weakness and without loss of  sensation.  Patient was given a number to call to set up neurology appointment we have put in a formal request but we are also going to get a Lyme titer a renal function and a CK total that we will reflex if elevated. - Ambulatory referral to Neurology - Lyme Disease Serology w/Reflex - Renal Function Panel  2. 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  Persistent.  Patient has lower back pain that goes bilateral to the thighs with some muscle tenderness noted as well.  However patient does have a straight leg raise greater on the left than right which is consistent with a lumbar disc and radiculopathy.  We will initiate prednisone  taper starting at 40 mg over 12-day.  And patient is also been encouraged to take some NSAIDs over-the-counter.  3. Myalgia, multiple sites Husband patient has muscle tenderness with minimal weakness I do not think there is a rhabdo concern and previous renal function was normal.  I do think that we will take a look at that as creatinine kinase with reflex if elevated in case there is a polymyalgia rheumatica concerned that we may need to investigate further if neuropathy is unlikely. - CK Total (and CKMB)     Alayne Allis, MD

## 2023-10-16 NOTE — Telephone Encounter (Signed)
 Called and left message to patient that I definitely needed to see her for her pain and that I have an idea what is going on depends on whether it is one-sided or involving both arms.  Patient has an appointment this afternoon and and I will see her in clinic for follow-up of admission.

## 2023-10-17 ENCOUNTER — Other Ambulatory Visit: Payer: Self-pay

## 2023-10-17 ENCOUNTER — Ambulatory Visit: Admit: 2023-10-17 | Payer: 59 | Admitting: Gastroenterology

## 2023-10-17 SURGERY — COLONOSCOPY WITH PROPOFOL
Anesthesia: General

## 2023-10-18 LAB — RENAL FUNCTION PANEL
Albumin: 4.6 g/dL (ref 3.9–4.9)
BUN/Creatinine Ratio: 16 (ref 9–23)
BUN: 15 mg/dL (ref 6–24)
CO2: 21 mmol/L (ref 20–29)
Calcium: 9.5 mg/dL (ref 8.7–10.2)
Chloride: 100 mmol/L (ref 96–106)
Creatinine, Ser: 0.95 mg/dL (ref 0.57–1.00)
Glucose: 110 mg/dL — ABNORMAL HIGH (ref 70–99)
Phosphorus: 4 mg/dL (ref 3.0–4.3)
Potassium: 4.4 mmol/L (ref 3.5–5.2)
Sodium: 137 mmol/L (ref 134–144)
eGFR: 75 mL/min/{1.73_m2} (ref >59)

## 2023-10-18 LAB — CK TOTAL AND CKMB (NOT AT ARMC)
CK-MB Index: 1.6 ng/mL (ref 0.0–5.3)
Total CK: 39 U/L (ref 32–182)

## 2023-10-18 LAB — LYME DISEASE SEROLOGY W/REFLEX: Lyme Total Antibody EIA: NEGATIVE

## 2023-10-24 ENCOUNTER — Other Ambulatory Visit: Payer: Self-pay | Admitting: Family Medicine

## 2023-10-24 DIAGNOSIS — M350B Sjogren syndrome with vasculitis: Secondary | ICD-10-CM

## 2023-10-24 DIAGNOSIS — R768 Other specified abnormal immunological findings in serum: Secondary | ICD-10-CM

## 2023-10-24 LAB — ENA+DNA/DS+SJORGEN'S
ENA RNP Ab: <0.2 AI (ref 0.0–0.9)
ENA SM Ab Ser-aCnc: <0.2 AI (ref 0.0–0.9)
ENA SSA (RO) Ab: <0.2 AI (ref 0.0–0.9)
ENA SSB (LA) Ab: 1.5 AI — ABNORMAL HIGH (ref 0.0–0.9)
dsDNA Ab: <1 [IU]/mL (ref 0–9)

## 2023-10-24 LAB — SPECIMEN STATUS REPORT

## 2023-10-24 LAB — ANA W/REFLEX: Anti Nuclear Antibody (ANA): POSITIVE — AB

## 2023-10-24 LAB — T3: T3, Total: 166 ng/dL (ref 71–180)

## 2023-12-05 ENCOUNTER — Other Ambulatory Visit: Payer: Self-pay

## 2023-12-05 ENCOUNTER — Telehealth: Payer: Self-pay

## 2023-12-05 NOTE — Telephone Encounter (Signed)
 Gastroenterology Pre-Procedure Review   Request Date: 01/01/24 Requesting Physician: Dr. Tobi Bastos   PATIENT REVIEW QUESTIONS: The patient responded to the following health history questions as indicated:     1. Are you having any GI issues? no 2. Do you have a personal history of Polyps? no 3. Do you have a family history of Colon Cancer or Polyps? yes (mother colon polyps) 4. Diabetes Mellitus? no 5. Joint replacements in the past 12 months?no 6. Major health problems in the past 3 months?no 7. Any artificial heart valves, MVP, or defibrillator?no 8. Takes [employer]. Has been advised to stop 1 week prior to colonoscopy.    MEDICATIONS & ALLERGIES:    Patient reports the following regarding taking any anticoagulation/antiplatelet therapy:   Plavix, Coumadin, Eliquis, Xarelto, Lovenox, Pradaxa, Brilinta, or Effient? no Aspirin? no  Patient confirms/reports the following medications:  Current Outpatient Medications  Medication Sig Dispense Refill   gentamicin ointment (GARAMYCIN) 0.1 % APPLY TO NOSTRIL WITH QTIP 3 TIMES DAILY X 5 DAYS AND AS NEEDED FOR CRUSTING AND SORES     hydroxychloroquine (PLAQUENIL) 200 MG tablet Take 1 tablet by mouth 2 (two) times daily.     albuterol (VENTOLIN HFA) 108 (90 Base) MCG/ACT inhaler Inhale 1-2 puffs into the lungs every 6 (six) hours as needed for wheezing or shortness of breath. 1 g 0   amLODipine (NORVASC) 10 MG tablet Take 1 tablet (10 mg total) by mouth daily. 30 tablet 1   budesonide-formoterol (SYMBICORT) 160-4.5 MCG/ACT inhaler Inhale into the lungs. fleming     cetirizine (ZYRTEC) 10 MG tablet Take by mouth.     EPINEPHrine 0.3 mg/0.3 mL IJ SOAJ injection USE AUTO INJECTOR AS NEEDED FOR ANAPHYLAXIS     gabapentin (NEURONTIN) 100 MG capsule Take 2 capsules (200 mg total) by mouth 3 (three) times daily. 180 capsule 0   methocarbamol (ROBAXIN) 500 MG tablet Take 1 tablet (500 mg total) by mouth every 8 (eight) hours as needed for muscle spasms. 30  tablet 0   ofloxacin (FLOXIN) 0.3 % OTIC solution Place 3 drops into the left ear 2 (two) times daily.     Olopatadine-Mometasone (RYALTRIS) [id number] MCG/ACT SUSP Place 1 spray into the nose 2 (two) times daily.     omeprazole (PRILOSEC) 20 MG capsule Take 20 mg by mouth daily.     predniSONE (DELTASONE) 10 MG tablet Taper 4,4,4,3,3,3,2,2,2,1,1,1 30 tablet 0   Semaglutide-Weight Management (WEGOVY) 0.25 MG/0.5ML SOAJ Inject into the skin.     No current facility-administered medications for this visit.    Patient confirms/reports the following allergies:  Allergies  Allergen Reactions   Sulfa Antibiotics Other (See Comments)    Generalized Rash   Other Other (See Comments)    No orders of the defined types were placed in this encounter.   AUTHORIZATION INFORMATION Primary Insurance: 1D#: Group #:  Secondary Insurance: 1D#: Group #:  SCHEDULE INFORMATION: Date:  Time: Location:

## 2023-12-25 ENCOUNTER — Encounter: Payer: Self-pay | Admitting: Gastroenterology

## 2023-12-25 ENCOUNTER — Telehealth: Payer: Self-pay

## 2023-12-25 NOTE — Telephone Encounter (Signed)
 okay

## 2023-12-25 NOTE — Telephone Encounter (Signed)
 Pt requesting call back has question in ref to procedure misplaced paper work

## 2023-12-29 ENCOUNTER — Encounter: Payer: Self-pay | Admitting: Gastroenterology

## 2024-01-01 ENCOUNTER — Encounter: Payer: Self-pay | Admitting: Gastroenterology

## 2024-01-01 ENCOUNTER — Ambulatory Visit

## 2024-01-01 ENCOUNTER — Ambulatory Visit
Admission: RE | Admit: 2024-01-01 | Discharge: 2024-01-01 | Disposition: A | Discharge status: Home / Self Care | Attending: Gastroenterology | Admitting: Gastroenterology

## 2024-01-01 ENCOUNTER — Encounter
Admission: RE | Disposition: A | Discharge status: Home / Self Care | Payer: Self-pay | Source: Home / Self Care | Attending: Gastroenterology

## 2024-01-01 DIAGNOSIS — K635 Polyp of colon: Secondary | ICD-10-CM | POA: Diagnosis not present

## 2024-01-01 DIAGNOSIS — Z8249 Family history of ischemic heart disease and other diseases of the circulatory system: Secondary | ICD-10-CM | POA: Insufficient documentation

## 2024-01-01 DIAGNOSIS — K219 Gastro-esophageal reflux disease without esophagitis: Secondary | ICD-10-CM | POA: Insufficient documentation

## 2024-01-01 DIAGNOSIS — Z5986 Financial insecurity: Secondary | ICD-10-CM | POA: Insufficient documentation

## 2024-01-01 DIAGNOSIS — K5730 Diverticulosis of large intestine without perforation or abscess without bleeding: Secondary | ICD-10-CM | POA: Insufficient documentation

## 2024-01-01 DIAGNOSIS — I10 Essential (primary) hypertension: Secondary | ICD-10-CM | POA: Insufficient documentation

## 2024-01-01 DIAGNOSIS — D126 Benign neoplasm of colon, unspecified: Secondary | ICD-10-CM

## 2024-01-01 DIAGNOSIS — Z87891 Personal history of nicotine dependence: Secondary | ICD-10-CM | POA: Insufficient documentation

## 2024-01-01 DIAGNOSIS — Z79899 Other long term (current) drug therapy: Secondary | ICD-10-CM | POA: Insufficient documentation

## 2024-01-01 DIAGNOSIS — J45909 Unspecified asthma, uncomplicated: Secondary | ICD-10-CM | POA: Insufficient documentation

## 2024-01-01 DIAGNOSIS — Z1211 Encounter for screening for malignant neoplasm of colon: Secondary | ICD-10-CM | POA: Diagnosis present

## 2024-01-01 HISTORY — DX: Unspecified osteoarthritis, unspecified site: M19.90

## 2024-01-01 HISTORY — DX: Secondary amenorrhea: N91.1

## 2024-01-01 HISTORY — DX: Postmenopausal bleeding: N95.0

## 2024-01-01 HISTORY — PX: COLONOSCOPY: SHX5424

## 2024-01-01 HISTORY — PX: POLYPECTOMY: SHX149

## 2024-01-01 LAB — POCT PREGNANCY, URINE: Preg Test, Ur: NEGATIVE

## 2024-01-01 SURGERY — COLONOSCOPY
Anesthesia: General

## 2024-01-01 MED ORDER — SODIUM CHLORIDE 0.9 % IV SOLN
INTRAVENOUS | Status: DC
  Administered 2024-01-01: 20 mL/h via INTRAVENOUS

## 2024-01-01 MED ORDER — PROPOFOL 500 MG/50ML IV EMUL
INTRAVENOUS | Status: DC | PRN
  Administered 2024-01-01: 200 ug/kg/min via INTRAVENOUS
  Administered 2024-01-01: 150 mg via INTRAVENOUS

## 2024-01-01 NOTE — Anesthesia Preprocedure Evaluation (Signed)
 Anesthesia Evaluation  Patient identified by MRN, date of birth, ID band Patient awake    Reviewed: Allergy & Precautions, NPO status , Patient's Chart, lab work & pertinent test results  History of Anesthesia Complications Negative for: history of anesthetic complications  Airway Mallampati: II  TM Distance: >3 FB Neck ROM: Full    Dental no notable dental hx. (+) Teeth Intact   Pulmonary asthma , neg sleep apnea, neg COPD, Patient abstained from smoking.Not current smoker, former smoker   Pulmonary exam normal breath sounds clear to auscultation       Cardiovascular Exercise Tolerance: Good METShypertension, Pt. on medications (-) CAD and (-) Past MI (-) dysrhythmias  Rhythm:Regular Rate:Normal - Systolic murmurs Patient notes that a new PCP heard a heart murmur, but on my exam I did not appreciate any murmur. Patient denies any cardiovascular symptoms   Neuro/Psych  PSYCHIATRIC DISORDERS Anxiety     negative neurological ROS     GI/Hepatic ,neg GERD  ,,(+)     (-) substance abuse    Endo/Other  neg diabetes  GLP1 taken 7 days ago, denies GI symptoms today  Renal/GU negative Renal ROS     Musculoskeletal   Abdominal  (+) + obese  Peds  Hematology   Anesthesia Other Findings Past Medical History: No date: ADD (attention deficit disorder) No date: Allergy No date: Anxiety No date: Arthritis No date: Asthma No date: Postmenopausal bleeding No date: Secondary amenorrhea  Reproductive/Obstetrics                             Anesthesia Physical Anesthesia Plan  ASA: 2  Anesthesia Plan: General   Post-op Pain Management: Minimal or no pain anticipated   Induction: Intravenous  PONV Risk Score and Plan: 3 and Propofol infusion, TIVA and Ondansetron   Airway Management Planned: Nasal Cannula  Additional Equipment: None  Intra-op Plan:   Post-operative Plan:   Informed  Consent: I have reviewed the patients History and Physical, chart, labs and discussed the procedure including the risks, benefits and alternatives for the proposed anesthesia with the patient or authorized representative who has indicated his/her understanding and acceptance.     Dental advisory given  Plan Discussed with: CRNA and Surgeon  Anesthesia Plan Comments: (Discussed risks of anesthesia with patient, including possibility of difficulty with spontaneous ventilation under anesthesia necessitating airway intervention, PONV, and rare risks such as cardiac or respiratory or neurological events, and allergic reactions. Discussed the role of CRNA in patient's perioperative care. Patient understands.)       Anesthesia Quick Evaluation

## 2024-01-01 NOTE — H&P (Signed)
 Stacy Salaam, MD [address], [place], [place], [address] [address], [place], [place], [address] Phone: [phone]  Fax: [phone]  Primary Care Physician:  Clarise Crooks, MD   Pre-Procedure History & Physical: HPI:  Stacy Ross is a 48 y.o. female is here for an colonoscopy.   Past Medical History:  Diagnosis Date   ADD (attention deficit disorder)    Allergy    Anxiety    Arthritis    Asthma    Postmenopausal bleeding    Secondary amenorrhea     Past Surgical History:  Procedure Laterality Date   right hand repair N/A     Prior to Admission medications   Medication Sig Start Date End Date Taking? Authorizing Provider  albuterol  (VENTOLIN  HFA) 108 (90 Base) MCG/ACT inhaler Inhale 1-2 puffs into the lungs every 6 (six) hours as needed for wheezing or shortness of breath. 09/21/21  Yes Clarise Crooks, MD  amLODipine  (NORVASC ) 10 MG tablet Take 1 tablet (10 mg total) by mouth daily. 10/14/23  Yes Darus Engels A, DO  amphetamine-dextroamphetamine (ADDERALL XR) 20 MG 24 hr capsule Take 30 mg by mouth daily.   Yes [provider]  atomoxetine (STRATTERA) 40 MG capsule Take 40 mg by mouth daily.   Yes [provider]  cetirizine  (ZYRTEC ) 10 MG tablet Take by mouth. 01/26/13  Yes [provider]  gabapentin  (NEURONTIN ) 100 MG capsule Take 2 capsules (200 mg total) by mouth 3 (three) times daily. 10/13/23  Yes Darus Engels A, DO  gentamicin ointment (GARAMYCIN) 0.1 % APPLY TO NOSTRIL WITH QTIP 3 TIMES DAILY X 5 DAYS AND AS NEEDED FOR CRUSTING AND SORES 10/19/23  Yes [provider]  hydroxychloroquine (PLAQUENIL) 200 MG tablet Take 1 tablet by mouth 2 (two) times daily. 11/10/23  Yes [provider]  methocarbamol  (ROBAXIN ) 500 MG tablet Take 1 tablet (500 mg total) by mouth every 8 (eight) hours as needed for muscle spasms. 10/13/23  Yes Darus Engels A, DO  ofloxacin  (FLOXIN ) 0.3 % OTIC solution Place 3  drops into the left ear 2 (two) times daily.   Yes [provider]  Olopatadine-Mometasone  (RYALTRIS) [id number] MCG/ACT SUSP Place 1 spray into the nose 2 (two) times daily. 10/10/23  Yes [provider]  omeprazole (PRILOSEC) 20 MG capsule Take 20 mg by mouth daily.   Yes [provider]  predniSONE  (DELTASONE ) 10 MG tablet Taper 4,4,4,3,3,3,2,2,2,1,1,1 10/16/23  Yes Jones, Deanna C, MD  Semaglutide-Weight Management (WEGOVY) 0.25 MG/0.5ML SOAJ Inject into the skin.   Yes [provider]  budesonide-formoterol  (SYMBICORT) 160-4.5 MCG/ACT inhaler Inhale into the lungs. fleming 10/19/21 10/12/23  [provider]  EPINEPHrine 0.3 mg/0.3 mL IJ SOAJ injection USE AUTO INJECTOR AS NEEDED FOR ANAPHYLAXIS    [provider]    Allergies as of 12/05/2023 - Review Complete 10/17/2023  Allergen Reaction Noted   Sulfa  antibiotics Other (See Comments) 10/17/2023   Other Other (See Comments) 12/05/2023    Family History  Problem Relation Age of Onset   Stroke Mother    Hypertension Father    Heart disease Maternal Grandfather    Diabetes Maternal Grandfather    Breast cancer Paternal Grandmother    Cancer Paternal Grandmother    Cancer Paternal Grandfather     Social History   Socioeconomic History   Marital status: Single    Spouse name: Not on file   Number of children: Not on file  Years of education: Not on file   Highest education level: Not on file  Occupational History   Not on file  Tobacco Use   Smoking status: Former    Current packs/day: 0.00    Average packs/day: 0.3 packs/day for 10.0 years (2.5 ttl pk-yrs)    Types: Cigarettes    Start date: 03/06/2011    Quit date: 03/05/2021    Years since quitting: 2.8   Smokeless tobacco: Never  Vaping Use   Vaping status: Never Used  Substance and Sexual Activity   Alcohol use: Yes    Comment: social   Drug use: No   Sexual activity: Yes  Other Topics Concern   Not on file  Social  History Narrative   Not on file   Social Drivers of Health   Financial Resource Strain: High Risk (12/05/2023)   Received from [facility]   Overall Financial Resource Strain (CARDIA)    Difficulty of Paying Living Expenses: Hard  Food Insecurity: No Food Insecurity (12/05/2023)   Received from [facility]   Hunger Vital Sign    Worried About Running Out of Food in the Last Year: Never true    Ran Out of Food in the Last Year: Never true  Transportation Needs: No Transportation Needs (12/05/2023)   Received from [facility] - Transportation    In the past 12 months, has lack of transportation kept you from medical appointments or from getting medications?: No    Lack of Transportation (Non-Medical): No  Physical Activity: Not on file  Stress: Not on file  Social Connections: Unknown (03/21/2022)   Received from [facility]   Social Network    Social Network: Not on file  Intimate Partner Violence: Not At Risk (10/13/2023)   Humiliation, Afraid, Rape, and Kick questionnaire    Fear of Current or Ex-Partner: No    Emotionally Abused: No    Physically Abused: No    Sexually Abused: No    Review of Systems: See HPI, otherwise negative ROS  Physical Exam: BP (!) 125/101   Pulse 82   Temp (!) 96.6 F (35.9 C) (Temporal)   Resp 20   Ht 5' (1.524 m)   Wt 91.6 kg   LMP 12/04/2020   SpO2 100%   BMI 39.45 kg/m  General:   Alert,  pleasant and cooperative in NAD Head:  Normocephalic and atraumatic. Neck:  Supple; no masses or thyromegaly. Lungs:  Clear throughout to auscultation, normal respiratory effort.    Heart:  +S1, +S2, Regular rate and rhythm, No edema. Abdomen:  Soft, nontender and nondistended. Normal bowel sounds, without guarding, and without rebound.   Neurologic:  Alert and  oriented x4;  grossly normal neurologically.  Impression/Plan: Stacy Ross is here for an colonoscopy to be  performed for Screening colonoscopy average risk   Risks, benefits, limitations, and alternatives regarding  colonoscopy have been reviewed with the patient.  Questions have been answered.  All parties agreeable.   Stacy Salaam, MD  01/01/2024, 10:11 AM

## 2024-01-01 NOTE — Transfer of Care (Signed)
 Immediate Anesthesia Transfer of Care Note  Patient: Stacy Ross  Procedure(s) Performed: COLONOSCOPY POLYPECTOMY, INTESTINE  Patient Location: PACU  Anesthesia Type:General  Level of Consciousness: awake  Airway & Oxygen Therapy: Patient Spontanous Breathing  Post-op Assessment: Report given to RN and Post -op Vital signs reviewed and stable  Post vital signs: Reviewed and stable  Last Vitals:  Vitals Value Taken Time  BP    Temp    Pulse    Resp    SpO2      Last Pain:  Vitals:   01/01/24 0957  TempSrc: Temporal  PainSc: 2          Complications: There were no known notable events for this encounter.

## 2024-01-01 NOTE — Anesthesia Postprocedure Evaluation (Signed)
 Anesthesia Post Note  Patient: Stacy Ross  Procedure(s) Performed: COLONOSCOPY POLYPECTOMY, INTESTINE  Patient location during evaluation: Endoscopy Anesthesia Type: General Level of consciousness: awake and alert Pain management: pain level controlled Vital Signs Assessment: post-procedure vital signs reviewed and stable Respiratory status: spontaneous breathing, nonlabored ventilation, respiratory function stable and patient connected to nasal cannula oxygen Cardiovascular status: blood pressure returned to baseline and stable Postop Assessment: no apparent nausea or vomiting Anesthetic complications: no Comments: Patient complaining of persistent arm pain distal to propofol infusion site at PIV. No redness or swelling. I reassured patient and advised her to take conservative measures such as OTC pain relievers as she would normally use, and ice/hot compresses.   There were no known notable events for this encounter.   Last Vitals:  Vitals:   01/01/24 0957 01/01/24 1116  BP: (!) 125/101 (!) (P) 98/58  Pulse: 82   Resp: 20 (P) 18  Temp: (!) 35.9 C (!) (P) 35.8 C  SpO2: 100% (P) 100%    Last Pain:  Vitals:   01/01/24 1116  TempSrc: (P) Temporal  PainSc: (P) 0-No pain                 Lattie Poli

## 2024-01-01 NOTE — Op Note (Signed)
 [facility] Patient Name: Stacy Ross Procedure Date: 01/01/2024 10:39 AM MRN: [id number] Account #: [id number] Date of Birth: Jul 20, 1976 Admit Type: Outpatient Age: 48 Room: [facility] ENDO ROOM 1 Gender: Female Note Status: Finalized Instrument Name: Charlyn Cooley [id number] Procedure:             Colonoscopy Indications:           Screening for colorectal malignant neoplasm Providers:             Luke Salaam MD, MD Referring MD:          Clarise Crooks, MD (Referring MD) Medicines:             Monitored Anesthesia Care Complications:         No immediate complications. Procedure:             Pre-Anesthesia Assessment:                        - Prior to the procedure, a History and Physical was                         performed, and patient medications, allergies and                         sensitivities were reviewed. The patient's tolerance                         of previous anesthesia was reviewed.                        - The risks and benefits of the procedure and the                         sedation options and risks were discussed with the                         patient. All questions were answered and informed                         consent was obtained.                        - After reviewing the risks and benefits, the patient                         was deemed in satisfactory condition to undergo the                         procedure.                        - ASA Grade Assessment: II - A patient with mild                         systemic disease.                        After obtaining informed consent, the colonoscope was                         passed under direct vision.  Throughout the procedure,                         the patient's blood pressure, pulse, and oxygen                         saturations were monitored continuously. The                         Colonoscope was introduced through the anus and                         advanced to  the the cecum, identified by the                         appendiceal orifice. The colonoscopy was performed                         with ease. The patient tolerated the procedure well.                         The quality of the bowel preparation was excellent.                         The ileocecal valve, appendiceal orifice, and rectum                         were photographed. Findings:      The perianal and digital rectal examinations were normal.      Two sessile polyps were found in the proximal ascending colon. The       polyps were 2 to 3 mm in size. These polyps were removed with a jumbo       cold forceps. Resection and retrieval were complete.      Multiple medium-mouthed diverticula were found in the left colon and       right colon.      The exam was otherwise without abnormality on direct and retroflexion       views. Impression:            - Two 2 to 3 mm polyps in the proximal ascending                         colon, removed with a jumbo cold forceps. Resected and                         retrieved.                        - Diverticulosis in the left colon and in the right                         colon.                        - The examination was otherwise normal on direct and                         retroflexion views. Recommendation:        - Discharge patient to home (with escort).                        -  Resume previous diet.                        - Continue present medications.                        - Await pathology results.                        - Repeat colonoscopy for surveillance based on                         pathology results. Procedure Code(s):     --- Professional ---                        [phone], Colonoscopy, flexible; with biopsy, single or                         multiple Diagnosis Code(s):     --- Professional ---                        Z12.11, Encounter for screening for malignant neoplasm                         of colon                         K57.30, Diverticulosis of large intestine without                         perforation or abscess without bleeding                        D12.2, Benign neoplasm of ascending colon CPT copyright 2022 American Medical Association. All rights reserved. The codes documented in this report are preliminary and upon coder review may  be revised to meet current compliance requirements. Luke Salaam, MD Luke Salaam MD, MD 01/01/2024 11:12:26 AM This report has been signed electronically. Number of Addenda: 0 Note Initiated On: 01/01/2024 10:39 AM Scope Withdrawal Time: 0 hours 12 minutes 18 seconds  Total Procedure Duration: 0 hours 15 minutes 47 seconds  Estimated Blood Loss:  Estimated blood loss: none.      [facility]

## 2024-01-02 ENCOUNTER — Ambulatory Visit
Admission: RE | Admit: 2024-01-02 | Discharge: 2024-01-02 | Disposition: A | Discharge status: Home / Self Care | Source: Ambulatory Visit | Attending: Family Medicine | Admitting: Family Medicine

## 2024-01-02 DIAGNOSIS — Z1231 Encounter for screening mammogram for malignant neoplasm of breast: Secondary | ICD-10-CM | POA: Diagnosis present

## 2024-01-02 LAB — SURGICAL PATHOLOGY

## 2024-01-03 ENCOUNTER — Encounter: Payer: Self-pay | Admitting: Gastroenterology

## 2024-02-27 ENCOUNTER — Ambulatory Visit
Admission: RE | Admit: 2024-02-27 | Discharge: 2024-02-27 | Disposition: A | Discharge status: Home / Self Care | Source: Ambulatory Visit | Attending: Family Medicine | Admitting: Family Medicine

## 2024-02-27 ENCOUNTER — Other Ambulatory Visit: Payer: Self-pay | Admitting: Family Medicine

## 2024-02-27 ENCOUNTER — Ambulatory Visit
Admission: RE | Admit: 2024-02-27 | Discharge: 2024-02-27 | Disposition: A | Discharge status: Home / Self Care | Attending: Family Medicine | Admitting: Family Medicine

## 2024-02-27 DIAGNOSIS — S90859A Superficial foreign body, unspecified foot, initial encounter: Secondary | ICD-10-CM

## 2024-03-25 ENCOUNTER — Ambulatory Visit: Admitting: Family Medicine

## 2024-03-25 ENCOUNTER — Ambulatory Visit: Admitting: Student

## 2024-03-27 ENCOUNTER — Encounter: Payer: Self-pay | Admitting: Student

## 2024-03-27 ENCOUNTER — Ambulatory Visit (INDEPENDENT_AMBULATORY_CARE_PROVIDER_SITE_OTHER): Admitting: Student

## 2024-03-27 VITALS — BP 120/76 | HR 74 | Ht 60.0 in | Wt 197.2 lb

## 2024-03-27 DIAGNOSIS — E782 Mixed hyperlipidemia: Secondary | ICD-10-CM

## 2024-03-27 DIAGNOSIS — Z6838 Body mass index (BMI) 38.0-38.9, adult: Secondary | ICD-10-CM

## 2024-03-27 DIAGNOSIS — E669 Obesity, unspecified: Secondary | ICD-10-CM | POA: Insufficient documentation

## 2024-03-27 DIAGNOSIS — J4520 Mild intermittent asthma, uncomplicated: Secondary | ICD-10-CM

## 2024-03-27 DIAGNOSIS — M059 Rheumatoid arthritis with rheumatoid factor, unspecified: Secondary | ICD-10-CM

## 2024-03-27 DIAGNOSIS — E66812 Obesity, class 2: Secondary | ICD-10-CM

## 2024-03-27 DIAGNOSIS — Z1211 Encounter for screening for malignant neoplasm of colon: Secondary | ICD-10-CM

## 2024-03-27 DIAGNOSIS — R7303 Prediabetes: Secondary | ICD-10-CM

## 2024-03-27 DIAGNOSIS — J301 Allergic rhinitis due to pollen: Secondary | ICD-10-CM

## 2024-03-27 DIAGNOSIS — G5602 Carpal tunnel syndrome, left upper limb: Secondary | ICD-10-CM | POA: Diagnosis not present

## 2024-03-27 DIAGNOSIS — E8882 Obesity due to disruption of MC4R pathway: Secondary | ICD-10-CM

## 2024-03-27 DIAGNOSIS — F909 Attention-deficit hyperactivity disorder, unspecified type: Secondary | ICD-10-CM | POA: Diagnosis not present

## 2024-03-27 DIAGNOSIS — Z111 Encounter for screening for respiratory tuberculosis: Secondary | ICD-10-CM

## 2024-03-27 NOTE — Assessment & Plan Note (Signed)
 Symbicort and albuterol  as, see pulnology albutero aboutr 1 mon a month

## 2024-03-27 NOTE — Progress Notes (Unsigned)
 Established Patient Office Visit  Subjective   Patient ID: Stacy Ross, female    DOB: 21-Oct-1975  Age: 48 y.o. MRN: [id number]  Chief Complaint  Patient presents with   Establish Care    Patient here today for transition of care from Dr. 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  Adhd Used to be on adderall and strattera eosophageal   Adderall 30 mg er and 10 as needed and straterral  Was on adderall  martinique attention specialist hyperactiicty and   [facility] April and may   Patient Active Problem List   Diagnosis Date Noted   Adenomatous polyp of colon 01/01/2024   Colon cancer screening 01/01/2024   Prediabetes 10/13/2023   Extremity pain 10/12/2023   Asthma, chronic 10/12/2023   Hypertensive urgency 10/12/2023   Paresthesias 10/12/2023   Adult attention deficit hyperactivity disorder 08/02/2021   Allergic rhinitis due to pollen 08/02/2021   Secondary amenorrhea 08/02/2021   Eczema 08/02/2021   Menorrhagia 08/02/2021   Vitamin D deficiency 08/02/2021   Chronic fatigue 08/02/2021      ROS Refer to HPI    Objective:     Ht 5' (1.524 m)   LMP 12/04/2020   BMI 39.45 kg/m  BP Readings from Last 3 Encounters:  01/01/24 (!) (P) 98/58  10/16/23 132/80  10/13/23 122/77    Physical Exam     10/03/2023    3:35 PM 09/14/2023   10:01 AM 09/14/2023   10:00 AM  Depression screen PHQ 2/9  Decreased Interest 0 0 0  Down, Depressed, Hopeless 0 0 0  PHQ - 2 Score 0 0 0  Altered sleeping 0 0 2  Tired, decreased energy 0 0 2  Change in appetite 1 0 2  Feeling bad or failure about yourself  0 0 0  Trouble concentrating 2 1 2   Moving slowly or fidgety/restless 2 0 2  Suicidal thoughts 0 0 0  PHQ-9 Score 5 1 10   Difficult doing work/chores Not difficult at all Not difficult at all Not difficult at all       10/03/2023    3:35 PM 09/12/2023    4:46 PM 09/12/2023    4:19 PM 08/01/2023    8:32 AM  GAD 7 : Generalized Anxiety Score  Nervous, Anxious,  on Edge 0 0 0 0  Control/stop worrying 0 0 0 0  Worry too much - different things 0 0 0 0  Trouble relaxing 0 2 0 0  Restless 1 1 0 1  Easily annoyed or irritable 2 0 0 1  Afraid - awful might happen 0 0 0 0  Total GAD 7 Score 3 3 0 2  Anxiety Difficulty Not difficult at all Not difficult at all Not difficult at all Somewhat difficult    No results found for any visits on 03/27/24.  Last CBC Lab Results  Component Value Date   WBC 5.6 10/12/2023   HGB 12.8 10/12/2023   HCT 39.8 10/12/2023   MCV 86.3 10/12/2023   MCH 27.8 10/12/2023   RDW 13.8 10/12/2023   PLT 261 10/12/2023   Last metabolic panel Lab Results  Component Value Date   GLUCOSE 110 (H) 10/16/2023   NA 137 10/16/2023   K 4.4 10/16/2023   CL 100 10/16/2023   CO2 21 10/16/2023   BUN 15 10/16/2023   CREATININE 0.95 10/16/2023   EGFR 75 10/16/2023   CALCIUM 9.5 10/16/2023   PHOS 4.0 10/16/2023   PROT  6.9 08/01/2023   ALBUMIN 4.6 10/16/2023   LABGLOB 2.6 08/01/2023   BILITOT 0.5 08/01/2023   ALKPHOS 81 08/01/2023   AST 16 08/01/2023   ALT 15 08/01/2023   ANIONGAP 13 10/12/2023      The 10-year ASCVD risk score (Arnett DK, et al., 2019) is: 0.9%    Assessment & Plan:  There are no diagnoses linked to this encounter.   No follow-ups on file.    Harlene Saddler, MD

## 2024-03-27 NOTE — Assessment & Plan Note (Signed)
 Previously on straterra and adderall was seeing [facility] for medications, but has not been on medication for the past year. Will have her follow up for this to discuss restarting medication.

## 2024-03-27 NOTE — Assessment & Plan Note (Signed)
 Was doing weight wather  [place] during COVID aroudn 62 and has been on  prednisone   Teating sdrink les  Eating alchol  Exercises planning gym a couple weeks ago and was swimming

## 2024-03-27 NOTE — Assessment & Plan Note (Addendum)
 Tree grass, allergies hot, also taking zyrtec  twice week for the past year  Epi pen

## 2024-03-28 ENCOUNTER — Encounter: Payer: Self-pay | Admitting: Student

## 2024-03-28 ENCOUNTER — Ambulatory Visit: Payer: Self-pay | Admitting: Student

## 2024-03-28 ENCOUNTER — Telehealth: Payer: Self-pay

## 2024-03-28 ENCOUNTER — Other Ambulatory Visit (HOSPITAL_COMMUNITY): Payer: Self-pay

## 2024-03-28 DIAGNOSIS — M069 Rheumatoid arthritis, unspecified: Secondary | ICD-10-CM | POA: Insufficient documentation

## 2024-03-28 LAB — LIPID PANEL
Chol/HDL Ratio: 3.6 ratio (ref 0.0–4.4)
Cholesterol, Total: 186 mg/dL (ref 100–199)
HDL: 51 mg/dL (ref >39)
LDL Chol Calc (NIH): 115 mg/dL — ABNORMAL HIGH (ref 0–99)
Triglycerides: 112 mg/dL (ref 0–149)
VLDL Cholesterol Cal: 20 mg/dL (ref 5–40)

## 2024-03-28 MED ORDER — TIRZEPATIDE-WEIGHT MANAGEMENT 2.5 MG/0.5ML ~~LOC~~ SOLN
2.5000 mg | SUBCUTANEOUS | 1 refills | Status: DC
Start: 2024-03-28 — End: 2024-04-24

## 2024-03-28 NOTE — Assessment & Plan Note (Addendum)
 Has not obtain a splint today, encouraged her to get this and wear nightly. Continue gabapentin  for this a paraesthesias.

## 2024-03-28 NOTE — Assessment & Plan Note (Addendum)
 Last colonoscopy 01/01/2024, hyperplastic polyp on pathology x2, repeat in 10 years.

## 2024-03-28 NOTE — Telephone Encounter (Signed)
 Please review

## 2024-03-28 NOTE — Assessment & Plan Note (Signed)
 A1c 5.3% in 10/2023, repeat yearly.

## 2024-03-28 NOTE — Assessment & Plan Note (Signed)
 Seeing rheumatology for this, currently on methotrexate and plaquenil. Methotrexate recently increased. Is taking folate. Did get eye exam. Advised she refrain from alcohol on medications.

## 2024-03-29 ENCOUNTER — Other Ambulatory Visit (HOSPITAL_COMMUNITY): Payer: Self-pay

## 2024-03-29 NOTE — Telephone Encounter (Signed)
 Thanks for the update Kiana.  Patient states she did not fill the prescription at the pharmacy.

## 2024-03-29 NOTE — Telephone Encounter (Signed)
 Hey,  It was electronically sent.

## 2024-03-29 NOTE — Telephone Encounter (Signed)
 Then that's why it was filled already

## 2024-03-29 NOTE — Telephone Encounter (Signed)
 Good morning [name], Ms. Manton filled her prescription for Zepbound  yesterday. Unfortunately I can't see her copay because it has all ready gone through her insurance and  it shows refill too soon.  No PA is needed at this time.

## 2024-03-29 NOTE — Telephone Encounter (Signed)
 Hey, was her prescription sent to her pharmacy or was she handed a prescription? Anytime a prescription is electronicallly sent it is automatically filled.

## 2024-03-31 LAB — QUANTIFERON-TB GOLD PLUS
QuantiFERON Mitogen Value: >10 [IU]/mL
QuantiFERON Nil Value: 0.02 [IU]/mL
QuantiFERON TB1 Ag Value: 0.02 [IU]/mL
QuantiFERON TB2 Ag Value: 0.02 [IU]/mL
QuantiFERON-TB Gold Plus: NEGATIVE

## 2024-04-02 ENCOUNTER — Ambulatory Visit

## 2024-04-03 ENCOUNTER — Other Ambulatory Visit (HOSPITAL_COMMUNITY): Payer: Self-pay

## 2024-04-03 NOTE — Telephone Encounter (Signed)
 Good morning, just got off the phone with [employer] for an explanation. Stacy Ross plan has excluded any weight loss meds as of 12/05/2022. When I ran a test claim it looks like it goes through her insurance and Stacy Ross explained that it goes through but only for a discounted price and then the member can get a coupon that will bring the cost down more from the manufacturer of Zepbound . The cash price is [phone] but her plan discounts this price which brings her cost down to $1035.19 and then the coupon will bring it down to about half cost. Again her plan excludes a weight loss meds but will discount them, no PA is needed. I hope this helps!

## 2024-04-04 ENCOUNTER — Ambulatory Visit (INDEPENDENT_AMBULATORY_CARE_PROVIDER_SITE_OTHER)

## 2024-04-04 DIAGNOSIS — Z23 Encounter for immunization: Secondary | ICD-10-CM | POA: Diagnosis not present

## 2024-04-04 NOTE — Telephone Encounter (Signed)
 Please review. I have updated patient. She missed her nurse visit appointment for Tdap.

## 2024-04-04 NOTE — Progress Notes (Signed)
 Patient is in office today for a nurse visit for Immunization. Patient Injection was given in the  Left deltoid. Patient tolerated injection well.

## 2024-04-05 ENCOUNTER — Encounter: Payer: Self-pay | Admitting: Student

## 2024-04-05 NOTE — Telephone Encounter (Signed)
 Please review.  KP

## 2024-04-08 ENCOUNTER — Other Ambulatory Visit: Payer: Self-pay

## 2024-04-08 DIAGNOSIS — F909 Attention-deficit hyperactivity disorder, unspecified type: Secondary | ICD-10-CM

## 2024-04-08 NOTE — Telephone Encounter (Signed)
 Please review, patient is requesting to have Zepbound  Rx sent to [facility].

## 2024-04-24 ENCOUNTER — Encounter: Payer: Self-pay | Admitting: Dietician

## 2024-04-24 ENCOUNTER — Ambulatory Visit (INDEPENDENT_AMBULATORY_CARE_PROVIDER_SITE_OTHER): Admitting: Student

## 2024-04-24 ENCOUNTER — Encounter: Attending: Student | Admitting: Dietician

## 2024-04-24 ENCOUNTER — Encounter: Payer: Self-pay | Admitting: Student

## 2024-04-24 VITALS — BP 124/76 | HR 77 | Ht 60.0 in | Wt 192.0 lb

## 2024-04-24 VITALS — Ht 60.0 in | Wt 192.1 lb

## 2024-04-24 DIAGNOSIS — Z6838 Body mass index (BMI) 38.0-38.9, adult: Secondary | ICD-10-CM | POA: Insufficient documentation

## 2024-04-24 DIAGNOSIS — Z152 Genetic susceptibility to obesity: Secondary | ICD-10-CM

## 2024-04-24 DIAGNOSIS — E66812 Obesity, class 2: Secondary | ICD-10-CM

## 2024-04-24 DIAGNOSIS — E8882 Obesity due to disruption of MC4R pathway: Secondary | ICD-10-CM | POA: Diagnosis not present

## 2024-04-24 DIAGNOSIS — F909 Attention-deficit hyperactivity disorder, unspecified type: Secondary | ICD-10-CM | POA: Diagnosis not present

## 2024-04-24 DIAGNOSIS — M059 Rheumatoid arthritis with rheumatoid factor, unspecified: Secondary | ICD-10-CM

## 2024-04-24 MED ORDER — ZEPBOUND 2.5 MG/0.5ML ~~LOC~~ SOLN: 2.5000 mg | SUBCUTANEOUS | 1 refills | Status: DC

## 2024-04-24 NOTE — Progress Notes (Signed)
 Established Patient Office Visit  Subjective   Patient ID: Stacy Ross, female    DOB: 01-28-1976  Age: 48 y.o. MRN: [id number]  No chief complaint on file.   Stacy Ross with medical hx listed below presents today for follow up of obesity. Did not pick up zepbound  as this was not covered by insurance. Also has been unable to reestablish at psychiatry for ADHD. Having difficulty remember to take medication and with task completion due to her ADHD. Recently started new position at school and this has been going well.    Patient Active Problem List   Diagnosis Date Noted   Rheumatoid arthritis (HCC) 03/28/2024   Carpal tunnel syndrome of left wrist 03/27/2024   Obesity 03/27/2024   Colon cancer screening 01/01/2024   Prediabetes 10/13/2023   Asthma, chronic 10/12/2023   Adult attention deficit hyperactivity disorder 08/02/2021   Allergic rhinitis due to pollen 08/02/2021   Eczema 08/02/2021   Vitamin D deficiency 08/02/2021      ROS Refer to HPI    Objective:     Outpatient Encounter Medications as of 04/24/2024  Medication Sig Note   albuterol  (VENTOLIN  HFA) 108 (90 Base) MCG/ACT inhaler Inhale 1-2 puffs into the lungs every 6 (six) hours as needed for wheezing or shortness of breath. 10/12/2023: prn   budesonide-formoterol  (SYMBICORT) 160-4.5 MCG/ACT inhaler Inhale into the lungs. fleming    cetirizine  (ZYRTEC ) 10 MG tablet Take by mouth.    EPINEPHrine 0.3 mg/0.3 mL IJ SOAJ injection USE AUTO INJECTOR AS NEEDED FOR ANAPHYLAXIS    folic acid (FOLVITE) 1 MG tablet Take 1 mg by mouth daily.    gabapentin  (NEURONTIN ) 100 MG capsule Take 2 capsules (200 mg total) by mouth 3 (three) times daily.    gentamicin ointment (GARAMYCIN) 0.1 % APPLY TO NOSTRIL WITH QTIP 3 TIMES DAILY X 5 DAYS AND AS NEEDED FOR CRUSTING AND SORES (Patient not taking: Reported on 04/24/2024)    hydroxychloroquine (PLAQUENIL) 200 MG tablet Take 1 tablet by mouth 2 (two) times daily.    methotrexate  (RHEUMATREX) 2.5 MG tablet Take by mouth once a week.    montelukast (SINGULAIR) 10 MG tablet Take 10 mg by mouth at bedtime.    mupirocin  ointment (BACTROBAN ) 2 % Apply 1 Application topically 2 (two) times daily.    RETIN-A 0.05 % cream Apply topically at bedtime.    tirzepatide  (ZEPBOUND ) 2.5 MG/0.5ML injection vial Inject 2.5 mg into the skin once a week.    amphetamine-dextroamphetamine (ADDERALL XR) 20 MG 24 hr capsule Take 30 mg by mouth daily.    [DISCONTINUED] tirzepatide  (ZEPBOUND ) 2.5 MG/0.5ML injection vial Inject 2.5 mg into the skin once a week. (Patient not taking: Reported on 04/24/2024)    No facility-administered encounter medications on file as of 04/24/2024.    BP 124/76   Pulse 77   Ht 5' (1.524 m)   Wt 192 lb (87.1 kg)   LMP 12/04/2020   SpO2 96%   BMI 37.50 kg/m  BP Readings from Last 3 Encounters:  04/24/24 124/76  03/27/24 120/76  01/01/24 (!) (P) 98/58    Physical Exam Constitutional:      Appearance: Normal appearance.  HENT:     Head: Normocephalic and atraumatic.     Mouth/Throat:     Mouth: Mucous membranes are moist.     Pharynx: Oropharynx is clear.  Cardiovascular:     Rate and Rhythm: Normal rate and regular rhythm.     Pulses: Normal pulses.  Heart sounds: No murmur heard. Pulmonary:     Effort: Pulmonary effort is normal.     Breath sounds: No rhonchi or rales.  Abdominal:     General: Abdomen is flat. Bowel sounds are normal. There is no distension.     Palpations: Abdomen is soft.     Tenderness: There is no abdominal tenderness.  Musculoskeletal:     Right lower leg: No edema.     Left lower leg: No edema.  Skin:    General: Skin is warm and dry.     Capillary Refill: Capillary refill takes less than 2 seconds.  Neurological:     General: No focal deficit present.     Mental Status: She is alert and oriented to person, place, and time.  Psychiatric:        Mood and Affect: Mood normal.        Behavior: Behavior normal.         04/24/2024    8:00 AM 03/27/2024    4:16 PM 10/03/2023    3:35 PM  Depression screen PHQ 2/9  Decreased Interest 1 2 0  Down, Depressed, Hopeless 1 2 0  PHQ - 2 Score 2 4 0  Altered sleeping 3 3 0  Tired, decreased energy 0 0 0  Change in appetite 0 0 1  Feeling bad or failure about yourself  0 0 0  Trouble concentrating 3 3 2   Moving slowly or fidgety/restless 2 1 2   Suicidal thoughts 0 0 0  PHQ-9 Score 10 11 5   Difficult doing work/chores Somewhat difficult Somewhat difficult Not difficult at all       04/24/2024    8:00 AM 03/27/2024    4:16 PM 10/03/2023    3:35 PM 09/12/2023    4:46 PM  GAD 7 : Generalized Anxiety Score  Nervous, Anxious, on Edge 1 2 0 0  Control/stop worrying 1 2 0 0  Worry too much - different things 1 2 0 0  Trouble relaxing 3 2 0 2  Restless 2 2 1 1   Easily annoyed or irritable 2 2 2  0  Afraid - awful might happen 0 2 0 0  Total GAD 7 Score 10 14 3 3   Anxiety Difficulty Somewhat difficult Somewhat difficult Not difficult at all Not difficult at all    No results found for any visits on 04/24/24.  Last CBC Lab Results  Component Value Date   WBC 5.6 10/12/2023   HGB 12.8 10/12/2023   HCT 39.8 10/12/2023   MCV 86.3 10/12/2023   MCH 27.8 10/12/2023   RDW 13.8 10/12/2023   PLT 261 10/12/2023   Last metabolic panel Lab Results  Component Value Date   GLUCOSE 110 (H) 10/16/2023   NA 137 10/16/2023   K 4.4 10/16/2023   CL 100 10/16/2023   CO2 21 10/16/2023   BUN 15 10/16/2023   CREATININE 0.95 10/16/2023   EGFR 75 10/16/2023   CALCIUM 9.5 10/16/2023   PHOS 4.0 10/16/2023   PROT 6.9 08/01/2023   ALBUMIN 4.6 10/16/2023   LABGLOB 2.6 08/01/2023   BILITOT 0.5 08/01/2023   ALKPHOS 81 08/01/2023   AST 16 08/01/2023   ALT 15 08/01/2023   ANIONGAP 13 10/12/2023   Last lipids Lab Results  Component Value Date   CHOL 186 03/27/2024   HDL 51 03/27/2024   LDLCALC 115 (H) 03/27/2024   TRIG 112 03/27/2024   CHOLHDL 3.6 03/27/2024    Last hemoglobin A1c Lab Results  Component  Value Date   HGBA1C 5.3 10/13/2023   Last thyroid  functions Lab Results  Component Value Date   TSH 2.214 10/12/2023   T3TOTAL 166 10/16/2023   T4TOTAL 7.2 05/24/2021      The 10-year ASCVD risk score (Arnett DK, et al., 2019) is: 0.8%    Assessment & Plan:  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 Has trouble with overeating due to cravings. Is drinking more water and limiting portions. Has been walking 8K steps daily, her goals is 10K steps a day. Her insurance does not cover weight loss medications. She is interested in self pay for zepbound . Will send in zepbound  2.5 mg weekly to [facility]. Follow up in 4-6 weeks.    Adult attention deficit hyperactivity disorder Assessment & Plan: Unable to follow up with [facility] as provider is no longer at practice and other providers are not accepting new patients. Previously on adderall and stattera. Reports significant difficulty with concentration and remember to take RA medications due to this. Discussed setting alarm on phone to remind her to take medications.   Orders: -     Ambulatory referral to Psychiatry  Other orders -     Zepbound ; Inject 2.5 mg into the skin once a week.  Dispense: 2 mL; Refill: 1     Return in about 2 months (around 06/24/2024) for chronic f/u.    Harlene Saddler, MD

## 2024-04-24 NOTE — Assessment & Plan Note (Addendum)
 Unable to follow up with [facility] as provider is no longer at practice and other providers are not accepting new patients. Previously on adderall and stattera. Reports significant difficulty with concentration and remember to take RA medications due to this. Discussed setting alarm on phone to remind her to take medications.

## 2024-04-24 NOTE — Progress Notes (Signed)
 Medical Nutrition Therapy: Visit start time: 0930  end time: 1030  Assessment:   Referral Diagnosis: obesity Other medical history/ diagnoses: Rheumatoid arthritis, vitamin D deficiency Psychosocial issues/ stress concerns: ADHD, high stress level  Medications, supplements: reconciled list in medical record   Current weight: 192.1lbs Height: 5'0 BMI: 37.52   Progress and evaluation:  Patient reports making changes recently to promote weight loss, has lost 3lbs since May. She has increased water and decreased sugary drinks, increased protein based on friends advice Did weight watchers in 1990s, again about 10 yrs ago Reports frequent hunger, not feeling fulness at times.  Has had elevated HbA1C/ blood sugar in the past; recent HbA1C of 5.3%. Patient reports multiple treatment courses of steroids.  Food allergies: none Lives with 2-3(?) teenage stepchildren; partner left the home/ ended 10-year relationship at beginning of summer.  Patient seeks help with weight loss to reduce health risk, control arthritic pain/ inflammation    Dietary Intake:  Usual eating pattern includes 2 meals and several snacks per day. Dining out frequency: several meals per week. Who plans meals/ buys groceries? self Who prepares meals? self  Breakfast: protein shake coffee flavor; bagel with cream cheese/ meat Snack: cookies, rice krispie treats, pop tarts, other Lunch: was eating cafeteria Snack: same as am Supper: was eating takeout frequently, recently more at home -- pasta Snack:  Beverages: water; recently stopped coffee  Physical activity: ADLs, working in increasing gym once, walked 30 once   Intervention:   Nutrition Care Education:   Basic nutrition: basic food groups; appropriate nutrient balance; appropriate meal and snack schedule; general nutrition guidelines    Weight control: determining norm weight loss patterns; importance of low sugar and low fat choices; portion control of  starchy foods/ processed foods, emphasizing lean proteins, generous portions of low-carb veg, healthy fats; estimated energy needs for weight loss at 1400 kcal, provided guidance for 43% CHO, 27% pro, 30% fat; shifting nutritional balance in meals/ snacks to less carb, more protein and fiber for satiety Advanced nutrition: cooking techniques -- meal prepping, having lunches premade when working from home; planning to cook 2 meals per week Other:  Mediterranean eating pattern for anti-inflammatory benefits, reducing diabetes risk, satiety  Other intervention notes: Patient has been making positive and appropriate changes to promote weight loss with some success. Established additional goals for change with direction from patient.    Nutritional Diagnosis:  Imogene-3.3 Overweight/obesity As related to genetic and hormonal effects, stress, steroid treatment, excess calories, inadequate physical activity.  As evidenced by patient with current BMI of 37.5.   Education Materials given:  Get Healthy with [employer] with food lists, sample meal pattern Sample menus Snacking handout Visit summary with goals/ instructions to be viewed via patient portal   Learner/ who was taught:  Patient   Level of understanding: Verbalizes/ demonstrates competency  Demonstrated degree of understanding via:   Teach back Learning barriers: None  Willingness to learn/ readiness for change: Eager, change in progress  Monitoring and Evaluation:  Dietary intake, exercise, and body weight      follow up: 06/24/24 at 11:15am

## 2024-04-24 NOTE — Assessment & Plan Note (Addendum)
 Seeing nutritionist today. Has trouble with overeating due to cravings. Is drinking more water and limiting portions. Has been walking 8K steps daily, her goals is 10K steps a day. Her insurance does not cover weight loss medications. She is interested in self pay for zepbound . Will send in zepbound  2.5 mg weekly to [facility]. Follow up in 4-6 weeks.

## 2024-04-24 NOTE — Patient Instructions (Signed)
 Plan to eat a meal or snack every 3-5 hours during the day. Pre-plan meals and snacks to include more whole/ unprocessed foods such as fruits, vegetables, nuts, seeds, beans, whole grains. Include a lean/ low fat source of protein with each meal and some snacks. OK to have a low sugar (<10g) protein shake if not hungry. Continue to gradually increase the frequency and time of exercise. Work towards accumulating 150 minutes or more of exercise each week.

## 2024-05-10 NOTE — Progress Notes (Deleted)
 [facility] [address] [place], [place] [address]  Pulmonary Sleep Medicine   Office Visit Note  Patient Name: Stacy Ross DOB: [birth year] MRN [id number]    Chief Complaint: Obstructive Sleep Apnea visit  Brief History:  Stacy Ross is seen today for an annual follow up visit for CPAP@  The patient has a *** history of sleep apnea. Patient *** using PAP nightly.  The patient feels *** after sleeping with PAP.  The patient reports *** from PAP use. Reported sleepiness is  *** and the Epworth Sleepiness Score is *** out of 24. The patient *** take naps. The patient complains of the following: ***  The compliance download shows  compliance with an average use time of *** hours. The AHI is ***  The patient *** of limb movements disrupting sleep.  ROS  General: (-) fever, (-) chills, (-) night sweat Nose and Sinuses: (-) nasal stuffiness or itchiness, (-) postnasal drip, (-) nosebleeds, (-) sinus trouble. Mouth and Throat: (-) sore throat, (-) hoarseness. Neck: (-) swollen glands, (-) enlarged thyroid , (-) neck pain. Respiratory: *** cough, *** shortness of breath, *** wheezing. Neurologic: *** numbness, *** tingling. Psychiatric: *** anxiety, *** depression   Current Medication: Outpatient Encounter Medications as of 05/13/2024  Medication Sig Note   albuterol  (VENTOLIN  HFA) 108 (90 Base) MCG/ACT inhaler Inhale 1-2 puffs into the lungs every 6 (six) hours as needed for wheezing or shortness of breath. 10/12/2023: prn   amphetamine-dextroamphetamine (ADDERALL XR) 20 MG 24 hr capsule Take 30 mg by mouth daily.    budesonide-formoterol  (SYMBICORT) 160-4.5 MCG/ACT inhaler Inhale into the lungs. fleming    cetirizine  (ZYRTEC ) 10 MG tablet Take by mouth.    EPINEPHrine 0.3 mg/0.3 mL IJ SOAJ injection USE AUTO INJECTOR AS NEEDED FOR ANAPHYLAXIS    folic acid (FOLVITE) 1 MG tablet Take 1 mg by mouth daily.    gabapentin  (NEURONTIN ) 100 MG capsule Take 2 capsules (200 mg total) by  mouth 3 (three) times daily.    gentamicin ointment (GARAMYCIN) 0.1 % APPLY TO NOSTRIL WITH QTIP 3 TIMES DAILY X 5 DAYS AND AS NEEDED FOR CRUSTING AND SORES (Patient not taking: Reported on 04/24/2024)    hydroxychloroquine (PLAQUENIL) 200 MG tablet Take 1 tablet by mouth 2 (two) times daily.    methotrexate (RHEUMATREX) 2.5 MG tablet Take by mouth once a week.    montelukast (SINGULAIR) 10 MG tablet Take 10 mg by mouth at bedtime.    mupirocin  ointment (BACTROBAN ) 2 % Apply 1 Application topically 2 (two) times daily.    RETIN-A 0.05 % cream Apply topically at bedtime.    tirzepatide  (ZEPBOUND ) 2.5 MG/0.5ML injection vial Inject 2.5 mg into the skin once a week.    No facility-administered encounter medications on file as of 05/13/2024.    Surgical History: Past Surgical History:  Procedure Laterality Date   COLONOSCOPY N/A 01/01/2024   Procedure: COLONOSCOPY;  Surgeon: Therisa Bi, MD;  Location: [facility] ENDOSCOPY;  Service: Gastroenterology;  Laterality: N/A;   POLYPECTOMY  01/01/2024   Procedure: POLYPECTOMY, INTESTINE;  Surgeon: Therisa Bi, MD;  Location: [facility] ENDOSCOPY;  Service: Gastroenterology;;   right hand repair N/A     Medical History: Past Medical History:  Diagnosis Date   ADD (attention deficit disorder)    Allergy    Anxiety    Arthritis    Asthma    Postmenopausal bleeding    Secondary amenorrhea     Family History: Non contributory to the present illness  Social History:  Social History   Socioeconomic History   Marital status: Single    Spouse name: Not on file   Number of children: Not on file   Years of education: Not on file   Highest education level: Not on file  Occupational History   Not on file  Tobacco Use   Smoking status: Former    Current packs/day: 0.00    Average packs/day: 0.3 packs/day for 10.0 years (2.5 ttl pk-yrs)    Types: Cigarettes    Start date: 03/06/2011    Quit date: 03/05/2021    Years since quitting: 3.1   Smokeless tobacco:  Never  Vaping Use   Vaping status: Never Used  Substance and Sexual Activity   Alcohol use: Yes    Comment: rarely   Drug use: No   Sexual activity: Yes  Other Topics Concern   Not on file  Social History Narrative   Not on file   Social Drivers of Health   Financial Resource Strain: Low Risk  (01/02/2024)   Received from [facility]   Overall Financial Resource Strain (CARDIA)    Difficulty of Paying Living Expenses: Not very hard  Recent Concern: Financial Resource Strain - High Risk (12/05/2023)   Received from [facility]   Overall Financial Resource Strain (CARDIA)    Difficulty of Paying Living Expenses: Hard  Food Insecurity: No Food Insecurity (01/02/2024)   Received from [facility]   Hunger Vital Sign    Within the past 12 months, you worried that your food would run out before you got the money to buy more.: Never true    Within the past 12 months, the food you bought just didn't last and you didn't have money to get more.: Never true  Transportation Needs: No Transportation Needs (01/02/2024)   Received from [facility] - Transportation    In the past 12 months, has lack of transportation kept you from medical appointments or from getting medications?: No    Lack of Transportation (Non-Medical): No  Physical Activity: Not on file  Stress: Not on file  Social Connections: Unknown (03/21/2022)   Received from [facility]   Social Network    Social Network: Not on file  Intimate Partner Violence: Not At Risk (10/13/2023)   Humiliation, Afraid, Rape, and Kick questionnaire    Fear of Current or Ex-Partner: No    Emotionally Abused: No    Physically Abused: No    Sexually Abused: No    Vital Signs: Last menstrual period 12/04/2020. There is no height or weight on file to calculate BMI.    Examination: General Appearance: The patient is well-developed, well-nourished, and in no  distress. Neck Circumference: 40 cm Skin: Gross inspection of skin unremarkable. Head: normocephalic, no gross deformities. Eyes: no gross deformities noted. ENT: ears appear grossly normal Neurologic: Alert and oriented. No involuntary movements.  STOP BANG RISK ASSESSMENT S (snore) Have you been told that you snore?     YES/N   T (tired) Are you often tired, fatigued, or sleepy during the day?   YES/NO  O (obstruction) Do you stop breathing, choke, or gasp during sleep? YES/NO   P (pressure) Do you have or are you being treated for high blood pressure? YES/NO   B (BMI) Is your body index greater than 35 kg/m? YES   A (age) Are you 55 years old or older? NO   N (neck) Do you have a  neck circumference greater than 16 inches?   NO   G (gender) Are you a female? NO   TOTAL STOP/BANG "YES" ANSWERS        A STOP-Bang score of 2 or less is considered low risk, and a score of 5 or more is high risk for having either moderate or severe OSA. For people who score 3 or 4, doctors may need to perform further assessment to determine how likely they are to have OSA.         EPWORTH SLEEPINESS SCALE:  Scale:  (0)= no chance of dozing; (1)= slight chance of dozing; (2)= moderate chance of dozing; (3)= high chance of dozing  Chance  Situtation    Sitting and reading: ***    Watching TV: ***    Sitting Inactive in public: ***    As a passenger in car: ***      Lying down to rest: ***    Sitting and talking: ***    Sitting quielty after lunch: ***    In a car, stopped in traffic: ***   TOTAL SCORE:   *** out of 24    SLEEP STUDIES:  PSG (04/2023) AHI 7.4/hr, REM AHI 49.4/hr, min SpO2 75%   CPAP COMPLIANCE DATA:  Date Range: ***  Average Daily Use: *** hours  Median Use: ***  Compliance for > 4 Hours: *** days  AHI: *** respiratory events per hour  Days Used: ***  Mask Leak: ***  95th Percentile Pressure: ***         LABS: Recent Results (from the  past 2160 hours)  QuantiFERON-TB Gold Plus     Status: None   Collection Time: 03/27/24  4:26 PM  Result Value Ref Range   QuantiFERON Incubation Incubation performed.    QuantiFERON Criteria Comment     Comment: QuantiFERON-TB Gold Plus is a qualitative indirect test for M tuberculosis infection (including disease) and is intended for use in conjunction with risk assessment, radiography, and other medical and diagnostic evaluations. The QuantiFERON-TB Gold Plus result is determined by subtracting the Nil value from either TB antigen (Ag) value. The Mitogen tube serves as a control for the test.    QuantiFERON TB1 Ag Value 0.02 IU/mL   QuantiFERON TB2 Ag Value 0.02 IU/mL   QuantiFERON Nil Value 0.02 IU/mL   QuantiFERON Mitogen Value >10.00 IU/mL   QuantiFERON-TB Gold Plus Negative Negative    Comment: No response to M tuberculosis antigens detected. Infection with M tuberculosis is unlikely, but high risk individuals should be considered for additional testing (ATS/IDSA/CDC Clinical Practice Guidelines, 2017). The reference range is an Antigen minus Nil result of <0.35 IU/mL. Chemiluminescence immunoassay methodology   Lipid panel     Status: Abnormal   Collection Time: 03/27/24  4:28 PM  Result Value Ref Range   Cholesterol, Total 186 100 - 199 mg/dL   Triglycerides 887 0 - 149 mg/dL   HDL 51 >60 mg/dL   VLDL Cholesterol Cal 20 5 - 40 mg/dL   LDL Chol Calc (NIH) 884 (H) 0 - 99 mg/dL   Chol/HDL Ratio 3.6 0.0 - 4.4 ratio    Comment:                                   T. Chol/HDL Ratio  Men  Women                               1/2 Avg.Risk  3.4    3.3                                   Avg.Risk  5.0    4.4                                2X Avg.Risk  9.6    7.1                                3X Avg.Risk 23.4   11.0     Radiology: DG Foot Complete Right Result Date: 03/04/2024 CLINICAL DATA:  Pain. Possible foreign body in right fourth  toe. Patient reports bruise on bottom of right fourth toe and notch with a cause. EXAM: RIGHT FOOT COMPLETE - 3+ VIEW COMPARISON:  None Available. FINDINGS: Small plantar and moderate posterior calcaneal heel spurs. Mild degenerative spurring at the posterior dorsal aspect of the navicular. No acute fracture or dislocation. No radiopaque foreign body is seen. IMPRESSION: 1. Small plantar and moderate posterior calcaneal heel spurs. 2. No radiopaque foreign body is seen. Electronically Signed   By: Tanda Lyons M.D.   On: 03/04/2024 13:02    No results found.  No results found.    Assessment and Plan: Patient Active Problem List   Diagnosis Date Noted   Rheumatoid arthritis (HCC) 03/28/2024   Carpal tunnel syndrome of left wrist 03/27/2024   Obesity 03/27/2024   Colon cancer screening 01/01/2024   Prediabetes 10/13/2023   Asthma, chronic 10/12/2023   Adult attention deficit hyperactivity disorder 08/02/2021   Allergic rhinitis due to pollen 08/02/2021   Eczema 08/02/2021   Vitamin D deficiency 08/02/2021      The patient *** tolerate PAP and reports *** benefit from PAP use. The patient was reminded how to *** and advised to ***. The patient was also counselled on ***. The compliance is ***. The AHI is ***.   ***  General Counseling: I have discussed the findings of the evaluation and examination with [place].  I have also discussed any further diagnostic evaluation thatmay be needed or ordered today. Parneet verbalizes understanding of the findings of todays visit. We also reviewed her medications today and discussed drug interactions and side effects including but not limited excessive drowsiness and altered mental states. We also discussed that there is always a risk not just to her but also people around her. she has been encouraged to call the office with any questions or concerns that should arise related to todays visit.  No orders of the defined types were placed in this  encounter.       I have personally obtained a history, examined the patient, evaluated laboratory and imaging results, formulated the assessment and plan and placed orders.  Elfreda DELENA Bathe, MD [facility] Diplomate ABMS Pulmonary Critical Care Medicine and Sleep Medicine

## 2024-05-13 ENCOUNTER — Ambulatory Visit: Payer: Self-pay

## 2024-06-24 ENCOUNTER — Ambulatory Visit: Admitting: Dietician

## 2024-06-26 ENCOUNTER — Other Ambulatory Visit: Payer: Self-pay

## 2024-06-26 ENCOUNTER — Encounter: Payer: Self-pay | Admitting: Student

## 2024-06-26 NOTE — Telephone Encounter (Signed)
 Please review patient's message:

## 2024-06-28 ENCOUNTER — Ambulatory Visit: Admitting: Student

## 2024-06-28 VITALS — BP 124/74 | HR 85 | Ht 60.0 in | Wt 191.0 lb

## 2024-06-28 DIAGNOSIS — G4733 Obstructive sleep apnea (adult) (pediatric): Secondary | ICD-10-CM | POA: Diagnosis not present

## 2024-06-28 DIAGNOSIS — F411 Generalized anxiety disorder: Secondary | ICD-10-CM | POA: Diagnosis not present

## 2024-06-28 DIAGNOSIS — F909 Attention-deficit hyperactivity disorder, unspecified type: Secondary | ICD-10-CM

## 2024-06-28 DIAGNOSIS — G4730 Sleep apnea, unspecified: Secondary | ICD-10-CM | POA: Insufficient documentation

## 2024-06-28 DIAGNOSIS — Z152 Genetic susceptibility to obesity: Secondary | ICD-10-CM

## 2024-06-28 DIAGNOSIS — E8882 Obesity due to disruption of MC4R pathway: Secondary | ICD-10-CM

## 2024-06-28 DIAGNOSIS — Z6837 Body mass index (BMI) 37.0-37.9, adult: Secondary | ICD-10-CM

## 2024-06-28 DIAGNOSIS — E66812 Obesity, class 2: Secondary | ICD-10-CM | POA: Diagnosis not present

## 2024-06-28 MED ORDER — ZEPBOUND 5 MG/0.5ML ~~LOC~~ SOAJ: 5.0000 mg | SUBCUTANEOUS | 1 refills | Status: DC

## 2024-06-28 MED ORDER — SERTRALINE HCL 25 MG PO TABS: 25.0000 mg | ORAL_TABLET | Freq: Every day | ORAL | 3 refills | Status: DC

## 2024-06-28 NOTE — Assessment & Plan Note (Addendum)
 Increased anxiety. Feels sad all the time regarding prior relationship, increase agitation and lack of focus. Elevated GAD 10 today. Start sertraline 25 mg daily

## 2024-06-28 NOTE — Progress Notes (Signed)
 Established Patient Office Visit  Subjective   Patient ID: Stacy Ross, female    DOB: 04-13-1976  Age: 48 y.o. MRN: [id number]  Chief Complaint  Patient presents with   Anxiety    Stacy Ross with medical hx listed below presents today for anxiety and weight follow up.   Patient Active Problem List   Diagnosis Date Noted   Sleep apnea 06/28/2024   GAD (generalized anxiety disorder) 06/28/2024   Rheumatoid arthritis (HCC) 03/28/2024   Carpal tunnel syndrome of left wrist 03/27/2024   Obesity 03/27/2024   Colon cancer screening 01/01/2024   Prediabetes 10/13/2023   Asthma, chronic 10/12/2023   Adult attention deficit hyperactivity disorder 08/02/2021   Allergic rhinitis due to pollen 08/02/2021   Eczema 08/02/2021   Vitamin D deficiency 08/02/2021   ROS Refer to HPI    Objective:     Outpatient Encounter Medications as of 06/28/2024  Medication Sig Note   albuterol  (VENTOLIN  HFA) 108 (90 Base) MCG/ACT inhaler Inhale 1-2 puffs into the lungs every 6 (six) hours as needed for wheezing or shortness of breath. 10/12/2023: prn   amphetamine-dextroamphetamine (ADDERALL XR) 20 MG 24 hr capsule Take 30 mg by mouth daily.    budesonide-formoterol  (SYMBICORT) 160-4.5 MCG/ACT inhaler Inhale into the lungs. fleming    cetirizine  (ZYRTEC ) 10 MG tablet Take by mouth.    EPINEPHrine 0.3 mg/0.3 mL IJ SOAJ injection USE AUTO INJECTOR AS NEEDED FOR ANAPHYLAXIS    folic acid (FOLVITE) 1 MG tablet Take 1 mg by mouth daily.    gabapentin  (NEURONTIN ) 100 MG capsule Take 2 capsules (200 mg total) by mouth 3 (three) times daily.    hydroxychloroquine (PLAQUENIL) 200 MG tablet Take 1 tablet by mouth 2 (two) times daily.    methotrexate (RHEUMATREX) 2.5 MG tablet Take by mouth once a week.    montelukast (SINGULAIR) 10 MG tablet Take 10 mg by mouth at bedtime.    RETIN-A 0.05 % cream Apply topically at bedtime.    sertraline (ZOLOFT) 25 MG tablet Take 1 tablet (25 mg total) by mouth daily.     tirzepatide  (ZEPBOUND ) 2.5 MG/0.5ML injection vial Inject 2.5 mg into the skin once a week.    tirzepatide  (ZEPBOUND ) 5 MG/0.5ML Pen Inject 5 mg into the skin once a week.    gentamicin ointment (GARAMYCIN) 0.1 % APPLY TO NOSTRIL WITH QTIP 3 TIMES DAILY X 5 DAYS AND AS NEEDED FOR CRUSTING AND SORES (Patient not taking: Reported on 04/24/2024)    [DISCONTINUED] mupirocin  ointment (BACTROBAN ) 2 % Apply 1 Application topically 2 (two) times daily.    No facility-administered encounter medications on file as of 06/28/2024.    BP 124/74   Pulse 85   Ht 5' (1.524 m)   Wt 191 lb (86.6 kg)   LMP 12/04/2020   SpO2 95%   BMI 37.30 kg/m  BP Readings from Last 3 Encounters:  06/28/24 124/74  04/24/24 124/76  03/27/24 120/76    Physical Exam Constitutional:      Appearance: Normal appearance.  HENT:     Head: Normocephalic and atraumatic.  Cardiovascular:     Rate and Rhythm: Normal rate and regular rhythm.  Pulmonary:     Effort: Pulmonary effort is normal.     Breath sounds: No rhonchi or rales.  Abdominal:     General: Abdomen is flat. Bowel sounds are normal. There is no distension.     Palpations: Abdomen is soft.     Tenderness: There is no abdominal  tenderness.  Musculoskeletal:        General: Normal range of motion.     Right lower leg: No edema.     Left lower leg: No edema.  Skin:    General: Skin is warm and dry.     Capillary Refill: Capillary refill takes less than 2 seconds.  Neurological:     General: No focal deficit present.     Mental Status: She is alert and oriented to person, place, and time.  Psychiatric:        Mood and Affect: Mood normal.        Behavior: Behavior normal.        06/28/2024    1:46 PM 04/24/2024   11:57 AM 04/24/2024    8:00 AM  Depression screen PHQ 2/9  Decreased Interest 1 0 1  Down, Depressed, Hopeless 1 0 1  PHQ - 2 Score 2 0 2  Altered sleeping 3  3  Tired, decreased energy 0  0  Change in appetite 1  0  Feeling bad or  failure about yourself  0  0  Trouble concentrating 3  3  Moving slowly or fidgety/restless 2  2  Suicidal thoughts 0  0  PHQ-9 Score 11  10  Difficult doing work/chores Extremely dIfficult  Somewhat difficult       06/28/2024    1:46 PM 04/24/2024    8:00 AM 03/27/2024    4:16 PM 10/03/2023    3:35 PM  GAD 7 : Generalized Anxiety Score  Nervous, Anxious, on Edge 3 1 2  0  Control/stop worrying 3 1 2  0  Worry too much - different things 3 1 2  0  Trouble relaxing 3 3 2  0  Restless 3 2 2 1   Easily annoyed or irritable 3 2 2 2   Afraid - awful might happen 0 0 2 0  Total GAD 7 Score 18 10 14 3   Anxiety Difficulty Extremely difficult Somewhat difficult Somewhat difficult Not difficult at all    No results found for any visits on 06/28/24.  Last CBC Lab Results  Component Value Date   WBC 5.6 10/12/2023   HGB 12.8 10/12/2023   HCT 39.8 10/12/2023   MCV 86.3 10/12/2023   MCH 27.8 10/12/2023   RDW 13.8 10/12/2023   PLT 261 10/12/2023   Last metabolic panel Lab Results  Component Value Date   GLUCOSE 110 (H) 10/16/2023   NA 137 10/16/2023   K 4.4 10/16/2023   CL 100 10/16/2023   CO2 21 10/16/2023   BUN 15 10/16/2023   CREATININE 0.95 10/16/2023   EGFR 75 10/16/2023   CALCIUM 9.5 10/16/2023   PHOS 4.0 10/16/2023   PROT 6.9 08/01/2023   ALBUMIN 4.6 10/16/2023   LABGLOB 2.6 08/01/2023   BILITOT 0.5 08/01/2023   ALKPHOS 81 08/01/2023   AST 16 08/01/2023   ALT 15 08/01/2023   ANIONGAP 13 10/12/2023   Last lipids Lab Results  Component Value Date   CHOL 186 03/27/2024   HDL 51 03/27/2024   LDLCALC 115 (H) 03/27/2024   TRIG 112 03/27/2024   CHOLHDL 3.6 03/27/2024   Last hemoglobin A1c Lab Results  Component Value Date   HGBA1C 5.3 10/13/2023   Last thyroid  functions Lab Results  Component Value Date   TSH 2.214 10/12/2023   T3TOTAL 166 10/16/2023   T4TOTAL 7.2 05/24/2021      The 10-year ASCVD risk score (Arnett DK, et al., 2019) is: 0.8%     Assessment &  Plan:  Obstructive sleep apnea syndrome Assessment & Plan: Has had CPAP 06/10/2023 and has been using consistently until around February. Has been trying to use it more consistently. Called insurance and is not sure if they will cover zepbound  for sleep apena  Sleep study in August 2024 with AHI of 7.4 will try sending in zepbound  5 mg weekly   Orders: -     Zepbound ; Inject 5 mg into the skin once a week.  Dispense: 2 mL; Refill: 1  GAD (generalized anxiety disorder) Assessment & Plan: Increased anxiety. Feels sad all the time regarding prior relationship, increase agitation and lack of focus. Elevated GAD 10 today. Start sertraline 25 mg daily   Adult attention deficit hyperactivity disorder Assessment & Plan: Scheduled to see [facility] in February for this.    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 She continues to walk daily and dietary modifications. Increase zepbound  to 5 mg weekly. Reprots this may be covered for sleep apnea.    Other orders -     Sertraline HCl; Take 1 tablet (25 mg total) by mouth daily.  Dispense: 30 tablet; Refill: 3     Return in about 4 weeks (around 07/26/2024) for mood.    Harlene Saddler, MD

## 2024-06-28 NOTE — Assessment & Plan Note (Addendum)
 Has had CPAP 06/10/2023 and has been using consistently until around February. Has been trying to use it more consistently. Called insurance and is not sure if they will cover zepbound  for sleep apena  Sleep study in August 2024 with AHI of 7.4 will try sending in zepbound  5 mg weekly

## 2024-07-02 NOTE — Assessment & Plan Note (Signed)
 Tolerating zepbound  2.5 mg weekly weight is 191 lbs today similar to prior appointment. She continues to walk daily and dietary modifications. Increase zepbound  to 5 mg weekly. Reprots this may be covered for sleep apnea.

## 2024-07-02 NOTE — Assessment & Plan Note (Signed)
 Scheduled to see [facility] in February for this.

## 2024-07-21 ENCOUNTER — Other Ambulatory Visit: Payer: Self-pay | Admitting: Student

## 2024-07-23 NOTE — Telephone Encounter (Signed)
 May change to 90 day supply- included in original Rx Requested Prescriptions  Pending Prescriptions Disp Refills   sertraline (ZOLOFT) 25 MG tablet [Pharmacy Med Name: SERTRALINE HCL 25 MG TABLET] 90 tablet 0    Sig: TAKE 1 TABLET (25 MG TOTAL) BY MOUTH DAILY.     Psychiatry:  Antidepressants - SSRI - sertraline Passed - 07/23/2024  3:33 PM      Passed - AST in normal range and within 360 days    AST  Date Value Ref Range Status  08/01/2023 16 0 - 40 IU/L Final         Passed - ALT in normal range and within 360 days    ALT  Date Value Ref Range Status  08/01/2023 15 0 - 32 IU/L Final         Passed - Completed PHQ-2 or PHQ-9 in the last 360 days      Passed - Valid encounter within last 6 months    Recent Outpatient Visits           3 weeks ago Obstructive sleep apnea syndrome   [place] [facility] at [facility], Harlene, MD   3 months ago 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

## 2024-07-31 ENCOUNTER — Encounter: Payer: Self-pay | Admitting: Student

## 2024-07-31 ENCOUNTER — Other Ambulatory Visit: Payer: Self-pay

## 2024-07-31 DIAGNOSIS — G4733 Obstructive sleep apnea (adult) (pediatric): Secondary | ICD-10-CM

## 2024-07-31 MED ORDER — TIRZEPATIDE-WEIGHT MANAGEMENT 5 MG/0.5ML ~~LOC~~ SOLN: 5.0000 mg | SUBCUTANEOUS | 0 refills | Status: DC

## 2024-07-31 NOTE — Telephone Encounter (Signed)
 Please review.  KP

## 2024-08-05 ENCOUNTER — Ambulatory Visit: Admitting: Student

## 2024-08-06 ENCOUNTER — Other Ambulatory Visit: Payer: Self-pay

## 2024-08-06 DIAGNOSIS — G4733 Obstructive sleep apnea (adult) (pediatric): Secondary | ICD-10-CM

## 2024-08-06 MED ORDER — TIRZEPATIDE-WEIGHT MANAGEMENT 5 MG/0.5ML ~~LOC~~ SOLN: 5.0000 mg | SUBCUTANEOUS | 0 refills | Status: DC

## 2024-08-12 ENCOUNTER — Encounter: Payer: Self-pay | Admitting: Dietician

## 2024-08-12 ENCOUNTER — Other Ambulatory Visit: Payer: Self-pay | Admitting: Student

## 2024-08-12 NOTE — Progress Notes (Signed)
 Have not heard back from patient to reschedule her missed appointment from 06/24/24. Sent notification to referring provider.

## 2024-08-13 ENCOUNTER — Ambulatory Visit: Admitting: Student

## 2024-08-13 ENCOUNTER — Telehealth: Payer: Self-pay

## 2024-08-13 NOTE — Telephone Encounter (Signed)
 Sent pt a message on Mychart to call and schedule appt.  KP

## 2024-08-13 NOTE — Telephone Encounter (Signed)
-----   Message from Harlene Saddler sent at 08/13/2024  9:44 AM EST ----- Please let patient know she can schedule with RD if she found it helpful ----- Message ----- From: Gail Sharlet KIDD, RD Sent: 08/12/2024   2:40 PM EST To: Harlene Saddler, MD

## 2024-08-14 NOTE — Telephone Encounter (Signed)
 Discontinued 07/31/24, dose change.  Requested Prescriptions  Pending Prescriptions Disp Refills   ZEPBOUND  2.5 MG/0.5ML injection vial [Pharmacy Med Name: ZEPBOUND  2.5 MG/0.5ML SUBCUTANEOUS SOLUTION] 2 mL 1    Sig: INJECT 0.5 ML (2.5 MG) UNDER THE SKIN ONCE WEEKLY (0.5ML= 50 UNITS)     Off-Protocol Failed - 08/14/2024  9:56 AM      Failed - Medication not assigned to a protocol, review manually.      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 at [facility], Harlene, MD   10 months ago Neuropathy   [facility] at [facility] JAYSON, MD

## 2024-08-16 ENCOUNTER — Ambulatory Visit: Admitting: Student

## 2024-08-16 ENCOUNTER — Encounter: Payer: Self-pay | Admitting: Student

## 2024-08-16 ENCOUNTER — Other Ambulatory Visit (HOSPITAL_COMMUNITY): Payer: Self-pay

## 2024-08-16 VITALS — BP 120/70 | HR 68 | Temp 98.1°F | Ht 60.0 in | Wt 188.0 lb

## 2024-08-16 DIAGNOSIS — F411 Generalized anxiety disorder: Secondary | ICD-10-CM

## 2024-08-16 DIAGNOSIS — E66812 Obesity, class 2: Secondary | ICD-10-CM | POA: Diagnosis not present

## 2024-08-16 DIAGNOSIS — Z6837 Body mass index (BMI) 37.0-37.9, adult: Secondary | ICD-10-CM

## 2024-08-16 DIAGNOSIS — E8882 Obesity due to disruption of MC4R pathway: Secondary | ICD-10-CM

## 2024-08-16 DIAGNOSIS — F909 Attention-deficit hyperactivity disorder, unspecified type: Secondary | ICD-10-CM

## 2024-08-16 DIAGNOSIS — Z152 Genetic susceptibility to obesity: Secondary | ICD-10-CM | POA: Diagnosis not present

## 2024-08-16 DIAGNOSIS — G4733 Obstructive sleep apnea (adult) (pediatric): Secondary | ICD-10-CM | POA: Diagnosis not present

## 2024-08-16 MED ORDER — SERTRALINE HCL 50 MG PO TABS: 50.0000 mg | ORAL_TABLET | Freq: Every day | ORAL | 1 refills | Status: DC

## 2024-08-16 MED ORDER — ZEPBOUND 7.5 MG/0.5ML ~~LOC~~ SOAJ: 7.5000 mg | SUBCUTANEOUS | 3 refills | Status: DC

## 2024-08-16 NOTE — Telephone Encounter (Signed)
 Please place referral for pysch. If needed.  KP

## 2024-08-16 NOTE — Progress Notes (Signed)
 Established Patient Office Visit  Subjective   Patient ID: Stacy Ross, female    DOB: April 10, 1976  Age: 48 y.o. MRN: [id number]  Chief Complaint  Patient presents with   Follow-up    Medication follow up for Zepbound  and Sertraline     Stacy Ross is a 48 y.o. person with medical hx listed below who presents today for for follow up of obesity and anxiety.   Patient Active Problem List   Diagnosis Date Noted   Sleep apnea 06/28/2024   GAD (generalized anxiety disorder) 06/28/2024   Rheumatoid arthritis (HCC) 03/28/2024   Carpal tunnel syndrome of left wrist 03/27/2024   Obesity 03/27/2024   Colon cancer screening 01/01/2024   Prediabetes 10/13/2023   Asthma, chronic 10/12/2023   Adult attention deficit hyperactivity disorder 08/02/2021   Allergic rhinitis due to pollen 08/02/2021   Eczema 08/02/2021   Vitamin D deficiency 08/02/2021    ROS Refer to HPI    Objective:     Outpatient Encounter Medications as of 08/16/2024  Medication Sig Note   albuterol  (VENTOLIN  HFA) 108 (90 Base) MCG/ACT inhaler Inhale 1-2 puffs into the lungs every 6 (six) hours as needed for wheezing or shortness of breath. 10/12/2023: prn   amphetamine-dextroamphetamine (ADDERALL XR) 20 MG 24 hr capsule Take 30 mg by mouth daily.    budesonide-formoterol  (SYMBICORT) 160-4.5 MCG/ACT inhaler Inhale into the lungs. fleming    cetirizine  (ZYRTEC ) 10 MG tablet Take by mouth.    EPINEPHrine 0.3 mg/0.3 mL IJ SOAJ injection USE AUTO INJECTOR AS NEEDED FOR ANAPHYLAXIS    folic acid (FOLVITE) 1 MG tablet Take 1 mg by mouth daily.    gabapentin  (NEURONTIN ) 100 MG capsule Take 2 capsules (200 mg total) by mouth 3 (three) times daily.    hydroxychloroquine (PLAQUENIL) 200 MG tablet Take 1 tablet by mouth 2 (two) times daily.    methotrexate (RHEUMATREX) 2.5 MG tablet Take by mouth once a week.    montelukast (SINGULAIR) 10 MG tablet Take 10 mg by mouth at bedtime.    RETIN-A 0.05 % cream Apply topically at  bedtime.    tirzepatide  (ZEPBOUND ) 7.5 MG/0.5ML Pen Inject 7.5 mg into the skin once a week.    tirzepatide  5 MG/0.5ML injection vial Inject 5 mg into the skin once a week.    [DISCONTINUED] sertraline  (ZOLOFT ) 25 MG tablet TAKE 1 TABLET (25 MG TOTAL) BY MOUTH DAILY.    sertraline  (ZOLOFT ) 50 MG tablet Take 1 tablet (50 mg total) by mouth daily.    No facility-administered encounter medications on file as of 08/16/2024.    BP 120/70   Pulse 68   Temp 98.1 F (36.7 C) (Oral)   Ht 5' (1.524 m)   Wt 188 lb (85.3 kg)   LMP 12/04/2020   SpO2 97%   BMI 36.72 kg/m  BP Readings from Last 3 Encounters:  08/16/24 120/70  06/28/24 124/74  04/24/24 124/76    Physical Exam Constitutional:      Appearance: Normal appearance.  HENT:     Mouth/Throat:     Mouth: Mucous membranes are moist.     Pharynx: Oropharynx is clear.  Cardiovascular:     Rate and Rhythm: Normal rate and regular rhythm.  Pulmonary:     Effort: Pulmonary effort is normal.     Breath sounds: No rhonchi or rales.  Abdominal:     General: Abdomen is flat. Bowel sounds are normal. There is no distension.     Palpations: Abdomen is soft.  Tenderness: There is no abdominal tenderness.  Musculoskeletal:        General: Normal range of motion.     Right lower leg: No edema.     Left lower leg: No edema.  Skin:    General: Skin is warm and dry.     Capillary Refill: Capillary refill takes less than 2 seconds.  Neurological:     General: No focal deficit present.     Mental Status: She is alert and oriented to person, place, and time.  Psychiatric:        Mood and Affect: Mood normal.        Behavior: Behavior normal.        08/16/2024    8:31 AM 06/28/2024    1:46 PM 04/24/2024   11:57 AM  Depression screen PHQ 2/9  Decreased Interest 0 1 0  Down, Depressed, Hopeless 0 1 0  PHQ - 2 Score 0 2 0  Altered sleeping 3 3   Tired, decreased energy 0 0   Change in appetite 1 1   Feeling bad or failure about  yourself  0 0   Trouble concentrating 3 3   Moving slowly or fidgety/restless 3 2   Suicidal thoughts 0 0   PHQ-9 Score 10 11    Difficult doing work/chores Extremely dIfficult Extremely dIfficult      Data saved with a previous flowsheet row definition       08/16/2024    8:32 AM 06/28/2024    1:46 PM 04/24/2024    8:00 AM 03/27/2024    4:16 PM  GAD 7 : Generalized Anxiety Score  Nervous, Anxious, on Edge 1 3 1 2   Control/stop worrying 0 3 1 2   Worry too much - different things 0 3 1 2   Trouble relaxing 3 3 3 2   Restless 3 3 2 2   Easily annoyed or irritable 1 3 2 2   Afraid - awful might happen 0 0 0 2  Total GAD 7 Score 8 18 10 14   Anxiety Difficulty Somewhat difficult Extremely difficult Somewhat difficult Somewhat difficult    No results found for any visits on 08/16/24.  Last CBC Lab Results  Component Value Date   WBC 5.6 10/12/2023   HGB 12.8 10/12/2023   HCT 39.8 10/12/2023   MCV 86.3 10/12/2023   MCH 27.8 10/12/2023   RDW 13.8 10/12/2023   PLT 261 10/12/2023   Last metabolic panel Lab Results  Component Value Date   GLUCOSE 110 (H) 10/16/2023   NA 137 10/16/2023   K 4.4 10/16/2023   CL 100 10/16/2023   CO2 21 10/16/2023   BUN 15 10/16/2023   CREATININE 0.95 10/16/2023   EGFR 75 10/16/2023   CALCIUM 9.5 10/16/2023   PHOS 4.0 10/16/2023   PROT 6.9 08/01/2023   ALBUMIN 4.6 10/16/2023   LABGLOB 2.6 08/01/2023   BILITOT 0.5 08/01/2023   ALKPHOS 81 08/01/2023   AST 16 08/01/2023   ALT 15 08/01/2023   ANIONGAP 13 10/12/2023   Last lipids Lab Results  Component Value Date   CHOL 186 03/27/2024   HDL 51 03/27/2024   LDLCALC 115 (H) 03/27/2024   TRIG 112 03/27/2024   CHOLHDL 3.6 03/27/2024   Last hemoglobin A1c Lab Results  Component Value Date   HGBA1C 5.3 10/13/2023      The 10-year ASCVD risk score (Arnett DK, et al., 2019) is: 0.8%    Assessment & Plan:  GAD (generalized anxiety disorder) Assessment & Plan: GAD  improved to 8 from  last visit, still has anxiety regarding task completion due to ADHD.  Overall much improved. Increase to zoloft  to 50 mg daily.    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 Tolerating increase in zepbound  5 mg weekly. Would like to increase to 7.5 mg weekly.    Obstructive sleep apnea syndrome Assessment & Plan: Reports coworker was able to get zepbound  approved for OSA. She is using CPAP, last sleep study in 2024 with AHI of 7.4. Will discuss with pharmacy PA team regarding coverage.    Adult attention deficit hyperactivity disorder Assessment & Plan: Continue to have difficulty with focus, has been having trouble in insurance coverage at [employer]. Referral to psychology.   Orders: -     Ambulatory referral to Psychology  Other orders -     Sertraline  HCl; Take 1 tablet (50 mg total) by mouth daily.  Dispense: 90 tablet; Refill: 1 -     Zepbound ; Inject 7.5 mg into the skin once a week.  Dispense: 2 mL; Refill: 3     Return in about 3 months (around 11/14/2024) for obseity .    Harlene Saddler, MD

## 2024-08-16 NOTE — Assessment & Plan Note (Addendum)
 Weight is down about about 3 pounds to 188 lbs today. Tolerating increase in zepbound  5 mg weekly. Would like to increase to 7.5 mg weekly.

## 2024-08-16 NOTE — Assessment & Plan Note (Addendum)
 GAD improved to 8 from last visit, still has anxiety regarding task completion due to ADHD.  Overall much improved. Increase to zoloft  to 50 mg daily.

## 2024-08-16 NOTE — Assessment & Plan Note (Signed)
 Reports coworker was able to get zepbound  approved for OSA. She is using CPAP, last sleep study in 2024 with AHI of 7.4. Will discuss with pharmacy PA team regarding coverage.

## 2024-08-16 NOTE — Assessment & Plan Note (Signed)
 Continue to have difficulty with focus, has been having trouble in insurance coverage at [employer]. Referral to psychology.

## 2024-08-21 NOTE — Progress Notes (Signed)
 Follow-up   History of Present Illness: Stacy Ross is a 48 y.o. female  presents to clinic for recheck. The cough is gone, minimum rhinitis, non symptomatic gerd, on allergy shots. Suspect prior mold exposure was driving the cough.  She now works from home. Working on the weight   Current Medications:  Current Outpatient Medications  Medication Sig Dispense Refill   albuterol  MDI, PROVENTIL , VENTOLIN , PROAIR , HFA 90 mcg/actuation inhaler Inhale 2 inhalations into the lungs every 6 (six) hours as needed for Wheezing 25.5 each 3   budesonide-formoteroL  (SYMBICORT) 160-4.5 mcg/actuation inhaler INHALE 2 INHALATIONS INTO THE LUNGS 2 TIMES DAILY 10.2 g 5   EPINEPHrine (EPIPEN) 0.3 mg/0.3 mL auto-injector      folic acid (FOLVITE) 1 MG tablet Take 1 tablet (1 mg total) by mouth once daily 90 tablet 3   gabapentin  (NEURONTIN ) 300 MG capsule Take 300 mg in the morning, and 900 mg at night 360 capsule 0   hydroxychloroquine (PLAQUENIL) 200 mg tablet Take 1 tablet (200 mg total) by mouth 2 (two) times daily 60 tablet 2   methotrexate (RHEUMATREX) 2.5 MG tablet Take 5 tablets (12.5 mg total) by mouth every 7 (seven) days All on the same day 20 tablet 2   montelukast (SINGULAIR) 10 mg tablet TAKE 1 TABLET BY MOUTH EVERYDAY AT BEDTIME 90 tablet 1   tirzepatide  (ZEPBOUND ) 2.5 mg/0.5 mL injection Inject 2.5 mg subcutaneously every 7 (seven) days     UNABLE TO FIND Allergy Shots     No current facility-administered medications for this visit.    Problem List:  Patient Active Problem List  Diagnosis   Amenorrhea   Secondary amenorrhea   Postmenopausal bleeding   Asthma, chronic (HHS-HCC)    History: Past Medical History:  Diagnosis Date   ADHD    Allergic rhinitis due to pollen    Asthma, chronic (HHS-HCC) 10/12/2023   Chronic fatigue 08/02/2021   Menorrhagia 08/02/2021   Positive ANA (antinuclear antibody)    Prediabetes 10/13/2023   Rheumatoid arthritis involving  multiple sites with positive rheumatoid factor (CMS/HHS-HCC)    Secondary amenorrhea 08/02/2021   Vitamin D deficiency 08/02/2021    Past Surgical History:  Procedure Laterality Date   skin cancer removal from back Left 03/25/2024   hand surgery Right    Has had 16 surgeries on the right hand    Family History  Problem Relation Name Age of Onset   Skin cancer Father     High blood pressure (Hypertension) Father     Breast cancer Paternal Grandmother     Dementia Paternal Grandmother     Heart disease Paternal Grandfather     Diabetes Paternal Grandfather      Social History   Socioeconomic History   Marital status: Single  Tobacco Use   Smoking status: Former    Current packs/day: 0.00    Average packs/day: 0.3 packs/day for 10.0 years (2.5 ttl pk-yrs)    Types: Cigarettes    Start date: 2012    Quit date: 2022    Years since quitting: 3.9    Passive exposure: Past   Smokeless tobacco: Never  Vaping Use   Vaping status: Never Used  Substance and Sexual Activity   Alcohol use: Yes    Alcohol/week: 3.0 standard drinks of alcohol    Types: 3 Standard drinks or equivalent per week   Drug use: Never   Sexual activity: Yes    Partners: Male    Birth control/protection: Condom   Social Drivers  of Health   Financial Resource Strain: Low Risk (06/28/2024)   Received [facility]   Overall Financial Resource Strain (CARDIA)    How hard is it for you to pay for the very basics like food, housing, medical care, and heating?: Not very hard  Food Insecurity: No Food Insecurity (06/28/2024)   Received from [facility]   Hunger Vital Sign    Within the past 12 months, you worried that your food would run out before you got the money to buy more.: Never true    Within the past 12 months, the food you bought just didn't last and you didn't have money to get more.: Never true  Transportation Needs: No Transportation Needs (06/28/2024)   Received from  [facility] - Transportation    In the past 12 months, has lack of transportation kept you from medical appointments or from getting medications?: No    In the past 12 months, has lack of transportation kept you from meetings, work, or from getting things needed for daily living?: No    Allergies:  Sulfa  (sulfonamide antibiotics) and Other  Review of Systems: As per above. Pretty much unchanged with the exception that her breathing is improved. No associated cardiopulmonary, GI, GU, dermatological symptoms today. No focal neurological symptoms or psychological changes.   Physical Exam: BP 121/86   Wt 84.2 kg (185 lb 9.6 oz)   LMP 05/08/2021 Comment: irregular/postmenopausal  SpO2 99% Comment: room air  BMI 36.25 kg/m  84.2 kg (185 lb 9.6 oz) 99% General:  NAD. Able to speak in complete sentences without cough or dyspnea HEENT: Normocephalic, nontraumatic. Extraocular movements intact NECK: Supple. No JVD, nodes, thryomegaly CV: RRR no murmurs, gallops, rubs PULM: Normal respiratory effort, Clear to auscultation bilaterally without wheezing or crackles EXTREMITIESs: No significant edema, cyanosis or Homans'signs SKIN: Fair turgor. No rashes LYMPHATIC: No nodes NEURO: No gross deficits PSYCH: Appropriate affect   Diagnostics: omponent Ref Range & Units 1 yr ago     Eosinophil % 1.0 - 5.0 % 3.6    Component Ref Range & Units 1 yr ago     Immunoglobulin E, Total - [facility] 6 - 495 IU/mL 192    Component Ref Range & Units 1 yr ago     A.Fumigatus #1 Abs - [facility] Negative Negative   Micropolyspora faeni, IgG - [facility] Negative Negative   Thermoactinomyces vulgaris, IgG - [facility] Negative Negative   A. Pullulans Abs - [facility] Negative Negative   Thermoact. Saccharii - [facility] Negative Negative   Pigeon Serum Abs - [facility] Negative Negative  Resulting Agency [facility] <redacted file path>           Impres  Complete Pulmonary Function Test   Referring  Physician Theotis Lavelle BRAVO, MD   Reason for PFT Mild persistent asthmatic bronchitis without complication (HHS-HCC)      SPIROMETRY: FVC was 2.68 L, 96 % of predicted FEV1 was 2.07 L, 87 % of predicted FEV1/FVC ratio was  91 % of predicted FEF 25-75% liters per second was 65 % of predicted   LUNG VOLUMES: TLC was 113 % of predicted RV was 174 % of predicted   DIFFUSION CAPACITY: DLCO was 125 % of predicted DLCO/VA was 132 % of predicted     Good patient effort with good repeatability.   Spirometry is in normal range  Lung volumes c/w hyperinflation Diffusion capacity is in super normal range     Interpreting Physician Dr. Theotis  Her rhinitis less present. 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  Working from home now. HSP is negative. ? silent gerd. Rampant allergies. On allergy shots. The cough is now practically non existent Weight loss Stay on Albuterol  2 puffs q 6 prn, symbicort 160/4.5 two puffs q bid, singulair 10 mg q hs Flonase , zyrtec   Nasal rinses F/u in 20 weeks     Sleep apnea, on  cpap Encouraged to wear it nightly.  On cpap ( stay on same setting)

## 2024-08-22 ENCOUNTER — Other Ambulatory Visit: Payer: Self-pay

## 2024-08-22 ENCOUNTER — Encounter
Admission: RE | Admit: 2024-08-22 | Discharge: 2024-08-22 | Disposition: A | Source: Ambulatory Visit | Attending: Obstetrics and Gynecology | Admitting: Obstetrics and Gynecology

## 2024-08-22 ENCOUNTER — Telehealth: Payer: Self-pay | Admitting: Student

## 2024-08-22 DIAGNOSIS — Z01818 Encounter for other preprocedural examination: Secondary | ICD-10-CM

## 2024-08-22 HISTORY — DX: Personal history of nicotine dependence: Z87.891

## 2024-08-22 HISTORY — DX: Rheumatoid arthritis, unspecified: M06.9

## 2024-08-22 HISTORY — DX: Unspecified asthma, uncomplicated: J45.909

## 2024-08-22 HISTORY — DX: Vitamin D deficiency, unspecified: E55.9

## 2024-08-22 HISTORY — DX: Obesity, unspecified: E66.9

## 2024-08-22 HISTORY — DX: Sleep apnea, unspecified: G47.30

## 2024-08-22 HISTORY — DX: Postmenopausal bleeding: N95.0

## 2024-08-22 HISTORY — DX: Prediabetes: R73.03

## 2024-08-22 HISTORY — DX: Chronic fatigue, unspecified: R53.82

## 2024-08-22 MED ORDER — TIRZEPATIDE-WEIGHT MANAGEMENT 7.5 MG/0.5ML ~~LOC~~ SOLN: 7.5000 mg | SUBCUTANEOUS | 1 refills | Status: DC

## 2024-08-22 NOTE — Telephone Encounter (Signed)
 Copied from CRM #[id number]. Topic: Clinical - Prescription Issue >> Aug 22, 2024 11:56 AM Chasity T wrote: Reason for CRM: Gracelyn SAUNDERS calling from [employer] is calling to request a new prescription being sent over for villes for the patient due to them not being about to give out the pen for the Zepoound. She states that a new e scribe can be sent over to fill this request.

## 2024-08-22 NOTE — Patient Instructions (Addendum)
 Your procedure is scheduled on:08-27-24 Tuesday Report to the Registration Desk on the 1st floor of the [facility].Then proceed to the 2nd floor Surgery Desk To find out your arrival time, please call [phone] between 1PM - 3PM on:08-26-24 Monday If your arrival time is 6:00 am, do not arrive before that time as the Medical Mall entrance doors do not open until 6:00 am.  REMEMBER: Instructions that are not followed completely may result in serious medical risk, up to and including death; or upon the discretion of your surgeon and anesthesiologist your surgery may need to be rescheduled.  Do not eat food after midnight the night before surgery.  No gum chewing or hard candies.  You may however, drink CLEAR liquids up to 2 hours before you are scheduled to arrive for your surgery. Do not drink anything within 2 hours of your scheduled arrival time.  Clear liquids include: - water  - apple juice without pulp - gatorade (not RED colors) - black coffee or tea (Do NOT add milk or creamers to the coffee or tea) Do NOT drink anything that is not on this list.  In addition, your doctor has ordered for you to drink the provided:  Ensure Pre-Surgery Clear Carbohydrate Drink  Drinking this carbohydrate drink up to two hours before surgery helps to reduce insulin resistance and improve patient outcomes. Please complete drinking 2 hours before scheduled arrival time.  One week prior to surgery:Stop NOW (08-22-24) Stop Anti-inflammatories (NSAIDS) such as Advil, Aleve, Ibuprofen, Motrin, Naproxen, Naprosyn and Aspirin based products such as Excedrin, Goody's Powder, BC Powder. Stop ANY OVER THE COUNTER supplements until after surgery.  You may however, continue to take Tylenol  if needed for pain up until the day of surgery.  Stop tirzepatide  (ZEPBOUND ) 7 days prior to surgery-Do NOT take again until AFTER your surgery  Continue taking all of your other prescription medications up until the  day of surgery.  ON THE DAY OF SURGERY ONLY TAKE THESE MEDICATIONS WITH SIPS OF WATER: -fexofenadine (ALLEGRA)   Use your budesonide-formoterol  (SYMBICORT) Inhaler the day of surgery and bring your Albuterol  Inhaler to the hospital  No Alcohol for 24 hours before or after surgery.  No Smoking including e-cigarettes for 24 hours before surgery.  No chewable tobacco products for at least 6 hours before surgery.  No nicotine patches on the day of surgery.  Do not use any recreational drugs for at least a week (preferably 2 weeks) before your surgery.  Please be advised that the combination of cocaine and anesthesia may have negative outcomes, up to and including death. If you test positive for cocaine, your surgery will be cancelled.  On the morning of surgery brush your teeth with toothpaste and water, you may rinse your mouth with mouthwash if you wish. Do not swallow any toothpaste or mouthwash.  Do not wear jewelry, make-up, hairpins, clips or nail polish.  For welded (permanent) jewelry: bracelets, anklets, waist bands, etc.  Please have this removed prior to surgery.  If it is not removed, there is a chance that hospital personnel will need to cut it off on the day of surgery.  Do not wear lotions, powders, or perfumes.   Do not shave body hair from the neck down 48 hours before surgery.  Contact lenses, hearing aids and dentures may not be worn into surgery.  Do not bring valuables to the hospital. [facility] is not responsible for any missing/lost belongings or valuables.   Bring your C-PAP  to the hospital   Notify your doctor if there is any change in your medical condition (cold, fever, infection).  Wear comfortable clothing (specific to your surgery type) to the hospital.  After surgery, you can help prevent lung complications by doing breathing exercises.  Take deep breaths and cough every 1-2 hours. Your doctor may order a device called an Incentive Spirometer to  help you take deep breaths. When coughing or sneezing, hold a pillow firmly against your incision with both hands. This is called splinting. Doing this helps protect your incision. It also decreases belly discomfort.  If you are being admitted to the hospital overnight, leave your suitcase in the car. After surgery it may be brought to your room.  In case of increased patient census, it may be necessary for you, the patient, to continue your postoperative care in the Same Day Surgery department.  If you are being discharged the day of surgery, you will not be allowed to drive home. You will need a responsible individual to drive you home and stay with you for 24 hours after surgery.   If you are taking public transportation, you will need to have a responsible individual with you.  Please call the Pre-admissions Testing Dept. at [phone] if you have any questions about these instructions.  Surgery Visitation Policy:  Patients having surgery or a procedure may have two visitors.  Children under the age of 55 must have an adult with them who is not the patient.   Merchandiser, Retail to address health-related social needs:  [email or web address]

## 2024-08-22 NOTE — H&P (Signed)
 Preoperative History and Physical  Chief Complaint: Stacy Ross is a 48 y.o. G0P0000 here for surgical management of post menopausal bleeding.   No significant preoperative concerns.  History of Present Illness: 48 y.o. G0P0000 female who has had intermittent bleeding. She has had several ultrasound and a failed endometrial biopsy.  Her last period was 05/2021.  She has had some intermittent bleeding. However, she did go for > 1 year without a period. Her last ultrasound (below) showed a thin endometrial lining last time (after biopsy attempt). She recently started having vaginal bleeding again.  Her last time of bleeding was 08/11/2024 and lasted for 5 days.   Ultrasound demonstrates the following findings Adnexa: no masses seen  Uterus: anteverted with endometrial stripe  3.8 mm Additional: no abnormal findings   She has had no bleeding since her last visit. She denies hot flashes or other vaginal symptoms  Endometrial biopsy: MINUTE FRAGMENTS OF BENIGN ENDOCERVICAL GLANDS,  MUCUS, AND BLOOD. NO DIAGNOSTIC ENDOMETRIAL TISSUE IDENTIFIED.   Last pap smear: 12/2023 - NILM, HPV negative  Proposed surgery: hysteroscopy, dilation and curettage  Past Medical History:  Diagnosis Date   ADHD    Allergic rhinitis due to pollen    Asthma, chronic (HHS-HCC) 10/12/2023   Chronic fatigue 08/02/2021   Menorrhagia 08/02/2021   Obesity    Positive ANA (antinuclear antibody)    Prediabetes 10/13/2023   Rheumatoid arthritis involving multiple sites with positive rheumatoid factor (CMS/HHS-HCC)    Secondary amenorrhea 08/02/2021   Sleep apnea    Vitamin D deficiency 08/02/2021   Past Surgical History:  Procedure Laterality Date   skin cancer removal from back Left 03/25/2024   hand surgery Right    Has had 16 surgeries on the right hand   OB History  Gravida Para Term Preterm AB Living  0 0 0 0 0 0  SAB IAB Ectopic Molar Multiple Live Births  0 0 0 0 0 0  Patient denies any other pertinent  gynecologic issues.   Current Outpatient Medications on File Prior to Visit  Medication Sig Dispense Refill   albuterol  MDI, PROVENTIL , VENTOLIN , PROAIR , HFA 90 mcg/actuation inhaler Inhale 2 inhalations into the lungs every 6 (six) hours as needed for Wheezing 25.5 each 3   budesonide-formoteroL  (SYMBICORT) 160-4.5 mcg/actuation inhaler INHALE 2 INHALATIONS INTO THE LUNGS 2 TIMES DAILY 10.2 g 5   EPINEPHrine (EPIPEN) 0.3 mg/0.3 mL auto-injector      fluticasone  propionate (FLONASE ) 50 mcg/actuation nasal spray Place 2 sprays into both nostrils once daily 16 g 2   folic acid (FOLVITE) 1 MG tablet Take 1 tablet (1 mg total) by mouth once daily 90 tablet 3   gabapentin  (NEURONTIN ) 300 MG capsule Take 300 mg in the morning, and 900 mg at night 360 capsule 0   hydroxychloroquine (PLAQUENIL) 200 mg tablet Take 1 tablet (200 mg total) by mouth 2 (two) times daily 60 tablet 2   methotrexate (RHEUMATREX) 2.5 MG tablet Take 5 tablets (12.5 mg total) by mouth every 7 (seven) days All on the same day 20 tablet 2   montelukast (SINGULAIR) 10 mg tablet Take 1 tablet (10 mg total) by mouth at bedtime 90 tablet 1   tirzepatide  (ZEPBOUND ) 2.5 mg/0.5 mL injection Inject 2.5 mg subcutaneously every 7 (seven) days     UNABLE TO FIND Allergy Shots     No current facility-administered medications on file prior to visit.   Allergies  Allergen Reactions   Sulfa  (Sulfonamide Antibiotics) Muscle Pain and Other (See  Comments)    Generalized Rash;  8/20 patient state may be due to RA rather than sulfa    Other Other (See Comments)    Social History:   reports that she quit smoking about 3 years ago. Her smoking use included cigarettes. She started smoking about 13 years ago. She has a 2.5 pack-year smoking history. She has been exposed to tobacco smoke. She has never used smokeless tobacco. She reports current alcohol use of about 3.0 standard drinks of alcohol per week. She reports that she does not use  drugs.  Family History  Problem Relation Name Age of Onset   Skin cancer Father Lynwood    High blood pressure (Hypertension) Father Lynwood    Breast cancer Paternal Grandmother     Dementia Paternal Grandmother     Heart disease Paternal Grandfather     Diabetes Paternal Grandfather     Autoimmune disease Maternal Grandmother Bernice     Review of Systems: Noncontributory  PHYSICAL EXAM: Blood pressure 113/80, pulse 73, height 152.4 cm (5'), weight 83.1 kg (183 lb 3.2 oz), last menstrual period 05/08/2021. CONSTITUTIONAL: Well-developed, well-nourished female in no acute distress.  HENT:  Normocephalic, atraumatic, External right and left ear normal. Oropharynx is clear and moist EYES: Conjunctivae and EOM are normal. Pupils are equal, round, and reactive to light. No scleral icterus.  NECK: Normal range of motion, supple, no masses SKIN: Skin is warm and dry. No rash noted. Not diaphoretic. No erythema. No pallor. NEUROLGIC: Alert and oriented to person, place, and time. Normal reflexes, muscle tone coordination. No cranial nerve deficit noted. PSYCHIATRIC: Normal mood and affect. Normal behavior. Normal judgment and thought content. CARDIOVASCULAR: Normal heart rate noted, regular rhythm RESPIRATORY: Effort and breath sounds normal, no problems with respiration noted ABDOMEN: Soft, nontender, nondistended. PELVIC: Deferred MUSCULOSKELETAL: Normal range of motion. No edema and no tenderness. 2+ distal pulses.  Labs: No results found for this or any previous visit (from the past 2 weeks).  Imaging Studies: No results found.  Assessment: Patient Active Problem List  Diagnosis   Postmenopausal bleeding    Plan: Patient will undergo surgical management with the above-noted procedure.   The risks of surgery were discussed in detail with the patient including but not limited to: bleeding which may require transfusion or reoperation; infection which may require antibiotics; injury  to surrounding organs which may involve bowel, bladder, ureters ; need for additional procedures including laparoscopy or laparotomy; thromboembolic phenomenon, surgical site problems and other postoperative/anesthesia complications. Likelihood of success in alleviating the patient's condition was discussed. Routine postoperative instructions will be reviewed with the patient and her family in detail after surgery.  The patient concurred with the proposed plan, giving informed written consent for the surgery.   Preoperative prophylactic antibiotics, as indicated, and SCDs ordered on call to the OR.    She recently got out of a long-term relationship and wonders whether she might have an STI.  We will check her today.    Attestation Statement:   I personally performed the service. (TP)  Bryce Kimble TORIBIO MACE, MD  [facility] 08/22/2024 3:25 PM

## 2024-08-22 NOTE — Telephone Encounter (Signed)
 Sent in vials and sent pt a message on Mychart.  KP

## 2024-08-23 ENCOUNTER — Encounter: Payer: Self-pay | Admitting: Student

## 2024-08-27 ENCOUNTER — Ambulatory Visit
Admission: RE | Admit: 2024-08-27 | Discharge: 2024-08-27 | Disposition: A | Discharge status: Home / Self Care | Attending: Obstetrics and Gynecology | Admitting: Obstetrics and Gynecology

## 2024-08-27 ENCOUNTER — Ambulatory Visit: Admitting: Anesthesiology

## 2024-08-27 ENCOUNTER — Other Ambulatory Visit: Payer: Self-pay

## 2024-08-27 ENCOUNTER — Encounter: Admission: RE | Disposition: A | Payer: Self-pay | Attending: Obstetrics and Gynecology

## 2024-08-27 DIAGNOSIS — N950 Postmenopausal bleeding: Secondary | ICD-10-CM | POA: Insufficient documentation

## 2024-08-27 DIAGNOSIS — M069 Rheumatoid arthritis, unspecified: Secondary | ICD-10-CM | POA: Insufficient documentation

## 2024-08-27 DIAGNOSIS — J45909 Unspecified asthma, uncomplicated: Secondary | ICD-10-CM | POA: Diagnosis not present

## 2024-08-27 DIAGNOSIS — Z01818 Encounter for other preprocedural examination: Secondary | ICD-10-CM

## 2024-08-27 DIAGNOSIS — G4730 Sleep apnea, unspecified: Secondary | ICD-10-CM | POA: Insufficient documentation

## 2024-08-27 DIAGNOSIS — Z87891 Personal history of nicotine dependence: Secondary | ICD-10-CM | POA: Diagnosis not present

## 2024-08-27 DIAGNOSIS — N8500 Endometrial hyperplasia, unspecified: Secondary | ICD-10-CM | POA: Diagnosis not present

## 2024-08-27 HISTORY — PX: HYSTEROSCOPY WITH D & C: SHX1775

## 2024-08-27 LAB — POCT PREGNANCY, URINE: Preg Test, Ur: NEGATIVE

## 2024-08-27 SURGERY — DILATATION AND CURETTAGE /HYSTEROSCOPY
Anesthesia: General | Site: Uterus

## 2024-08-27 MED ORDER — LIDOCAINE HCL (CARDIAC) PF 100 MG/5ML IV SOSY
PREFILLED_SYRINGE | INTRAVENOUS | Status: DC | PRN
  Administered 2024-08-27: 100 mg via INTRAVENOUS

## 2024-08-27 MED ORDER — PROPOFOL 1000 MG/100ML IV EMUL
INTRAVENOUS | Status: AC
  Filled 2024-08-27: qty 100

## 2024-08-27 MED ORDER — HYDROCODONE-ACETAMINOPHEN 5-325 MG PO TABS: 1.0000 | ORAL_TABLET | Freq: Four times a day (QID) | ORAL | 0 refills | Status: AC | PRN

## 2024-08-27 MED ORDER — FENTANYL CITRATE (PF) 100 MCG/2ML IJ SOLN
INTRAMUSCULAR | Status: AC
  Filled 2024-08-27: qty 2

## 2024-08-27 MED ORDER — MIDAZOLAM HCL 2 MG/2ML IJ SOLN
INTRAMUSCULAR | Status: AC
  Filled 2024-08-27: qty 2

## 2024-08-27 MED ORDER — SILVER NITRATE-POT NITRATE 75-25 % EX MISC
CUTANEOUS | Status: DC | PRN
  Administered 2024-08-27: 2

## 2024-08-27 MED ORDER — LACTATED RINGERS IV SOLN: INTRAVENOUS | Status: DC

## 2024-08-27 MED ORDER — DEXMEDETOMIDINE HCL IN NACL 80 MCG/20ML IV SOLN
INTRAVENOUS | Status: DC | PRN
  Administered 2024-08-27: 8 ug via INTRAVENOUS
  Administered 2024-08-27: 4 ug via INTRAVENOUS
  Administered 2024-08-27: 8 ug via INTRAVENOUS
  Administered 2024-08-27: 4 ug via INTRAVENOUS

## 2024-08-27 MED ORDER — DEXAMETHASONE SOD PHOSPHATE PF 10 MG/ML IJ SOLN
INTRAMUSCULAR | Status: DC | PRN
  Administered 2024-08-27: 10 mg via INTRAVENOUS

## 2024-08-27 MED ORDER — FENTANYL CITRATE (PF) 100 MCG/2ML IJ SOLN
INTRAMUSCULAR | Status: DC | PRN
  Administered 2024-08-27 (×2): 50 ug via INTRAVENOUS

## 2024-08-27 MED ORDER — ACETAMINOPHEN 10 MG/ML IV SOLN: 1000.0000 mg | Freq: Once | INTRAVENOUS | Status: DC | PRN

## 2024-08-27 MED ORDER — ONDANSETRON HCL 4 MG/2ML IJ SOLN
INTRAMUSCULAR | Status: AC
  Filled 2024-08-27: qty 2

## 2024-08-27 MED ORDER — 0.9 % SODIUM CHLORIDE (POUR BTL) OPTIME
TOPICAL | Status: DC | PRN
  Administered 2024-08-27: 500 mL

## 2024-08-27 MED ORDER — DIPHENHYDRAMINE HCL 50 MG/ML IJ SOLN
INTRAMUSCULAR | Status: DC | PRN
  Administered 2024-08-27: 25 mg via INTRAVENOUS

## 2024-08-27 MED ORDER — PHENYLEPHRINE 80 MCG/ML (10ML) SYRINGE FOR IV PUSH (FOR BLOOD PRESSURE SUPPORT)
PREFILLED_SYRINGE | INTRAVENOUS | Status: AC
  Filled 2024-08-27: qty 10

## 2024-08-27 MED ORDER — PROPOFOL 10 MG/ML IV BOLUS
INTRAVENOUS | Status: DC | PRN
  Administered 2024-08-27: 150 mg via INTRAVENOUS
  Administered 2024-08-27: 150 ug/kg/min via INTRAVENOUS

## 2024-08-27 MED ORDER — PROPOFOL 10 MG/ML IV BOLUS
INTRAVENOUS | Status: AC
  Filled 2024-08-27: qty 20

## 2024-08-27 MED ORDER — OXYCODONE HCL 5 MG PO TABS
5.0000 mg | ORAL_TABLET | Freq: Once | ORAL | Status: AC | PRN
  Administered 2024-08-27: 5 mg via ORAL

## 2024-08-27 MED ORDER — SODIUM CHLORIDE 0.9 % IR SOLN
Status: DC | PRN
  Administered 2024-08-27: 3000 mL

## 2024-08-27 MED ORDER — CHLORHEXIDINE GLUCONATE 0.12 % MT SOLN
15.0000 mL | Freq: Once | OROMUCOSAL | Status: AC
  Administered 2024-08-27: 15 mL via OROMUCOSAL

## 2024-08-27 MED ORDER — ONDANSETRON HCL 4 MG/2ML IJ SOLN
INTRAMUSCULAR | Status: DC | PRN
  Administered 2024-08-27: 4 mg via INTRAVENOUS

## 2024-08-27 MED ORDER — DIPHENHYDRAMINE HCL 50 MG/ML IJ SOLN
INTRAMUSCULAR | Status: AC
  Filled 2024-08-27: qty 1

## 2024-08-27 MED ORDER — PHENYLEPHRINE 80 MCG/ML (10ML) SYRINGE FOR IV PUSH (FOR BLOOD PRESSURE SUPPORT)
PREFILLED_SYRINGE | INTRAVENOUS | Status: DC | PRN
  Administered 2024-08-27: 80 ug via INTRAVENOUS

## 2024-08-27 MED ORDER — LACTATED RINGERS IV SOLN: INTRAVENOUS | Status: AC

## 2024-08-27 MED ORDER — CHLORHEXIDINE GLUCONATE 0.12 % MT SOLN
OROMUCOSAL | Status: AC
  Filled 2024-08-27: qty 15

## 2024-08-27 MED ORDER — MIDAZOLAM HCL (PF) 2 MG/2ML IJ SOLN
INTRAMUSCULAR | Status: DC | PRN
  Administered 2024-08-27: 2 mg via INTRAVENOUS

## 2024-08-27 MED ORDER — LIDOCAINE HCL (PF) 2 % IJ SOLN
INTRAMUSCULAR | Status: AC
  Filled 2024-08-27: qty 5

## 2024-08-27 MED ORDER — OXYCODONE HCL 5 MG/5ML PO SOLN: 5.0000 mg | Freq: Once | ORAL | Status: AC | PRN

## 2024-08-27 MED ORDER — OXYCODONE HCL 5 MG PO TABS
ORAL_TABLET | ORAL | Status: AC
  Filled 2024-08-27: qty 1

## 2024-08-27 MED ORDER — FENTANYL CITRATE (PF) 100 MCG/2ML IJ SOLN
25.0000 ug | INTRAMUSCULAR | Status: DC | PRN
  Administered 2024-08-27 (×2): 50 ug via INTRAVENOUS

## 2024-08-27 MED ORDER — ORAL CARE MOUTH RINSE: 15.0000 mL | Freq: Once | OROMUCOSAL | Status: AC

## 2024-08-27 MED ORDER — ONDANSETRON HCL 4 MG/2ML IJ SOLN: 4.0000 mg | Freq: Once | INTRAMUSCULAR | Status: DC | PRN

## 2024-08-27 SURGICAL SUPPLY — 24 items
BAG URINE DRAIN 2000ML AR STRL (UROLOGICAL SUPPLIES) IMPLANT
BASIN KIT SINGLE STR (MISCELLANEOUS) ×1 IMPLANT
CATH URTH 16FR FL 2W BLN LF (CATHETERS) IMPLANT
DEVICE MYOSURE LITE (MISCELLANEOUS) IMPLANT
DEVICE MYOSURE REACH (MISCELLANEOUS) IMPLANT
DRSG TELFA 3X8 NADH STRL (GAUZE/BANDAGES/DRESSINGS) IMPLANT
ELECTRODE REM PT RTRN 9FT ADLT (ELECTROSURGICAL) ×1 IMPLANT
GLOVE BIO SURGEON STRL SZ7 (GLOVE) ×1 IMPLANT
GLOVE BIOGEL PI IND STRL 7.5 (GLOVE) ×1 IMPLANT
GOWN STRL REUS W/ TWL LRG LVL3 (GOWN DISPOSABLE) ×2 IMPLANT
KIT PROCED FLUENT PRO FLT212S (KITS) ×1 IMPLANT
KIT TURNOVER CYSTO (KITS) ×1 IMPLANT
MANIFOLD NEPTUNE II (INSTRUMENTS) ×1 IMPLANT
PACK DNC HYST (MISCELLANEOUS) ×1 IMPLANT
PAD OB MATERNITY 11 LF (PERSONAL CARE ITEMS) ×1 IMPLANT
PAD PREP OB/GYN DISP 24X41 (PERSONAL CARE ITEMS) ×1 IMPLANT
SCRUB CHG 4% DYNA-HEX 4OZ (MISCELLANEOUS) ×1 IMPLANT
SEAL ROD LENS SCOPE MYOSURE (ABLATOR) ×1 IMPLANT
SET CYSTO IRRIGATION (SET/KITS/TRAYS/PACK) IMPLANT
SOL .9 NS 3000ML IRR UROMATIC (IV SOLUTION) ×1 IMPLANT
SOLN STERILE WATER 500 ML (IV SOLUTION) ×1 IMPLANT
SURGILUBE 2OZ TUBE FLIPTOP (MISCELLANEOUS) ×1 IMPLANT
TRAP FLUID SMOKE EVACUATOR (MISCELLANEOUS) ×1 IMPLANT
TUBING CONNECTING 10 (TUBING) ×1 IMPLANT

## 2024-08-27 NOTE — Anesthesia Procedure Notes (Signed)
 Procedure Name: LMA Insertion Date/Time: 08/27/2024 11:46 AM  Performed by: Lorriane Arabia, CRNAPre-anesthesia Checklist: Patient identified, Patient being monitored, Timeout performed, Emergency Drugs available and Suction available Patient Re-evaluated:Patient Re-evaluated prior to induction Oxygen Delivery Method: Circle system utilized Preoxygenation: Pre-oxygenation with 100% oxygen Induction Type: IV induction Ventilation: Mask ventilation without difficulty LMA: LMA inserted LMA Size: 4.0 Tube type: Oral Number of attempts: 1 Placement Confirmation: positive ETCO2 and breath sounds checked- equal and bilateral Tube secured with: Tape Dental Injury: Teeth and Oropharynx as per pre-operative assessment

## 2024-08-27 NOTE — Anesthesia Preprocedure Evaluation (Addendum)
"                                    Anesthesia Evaluation  Patient identified by MRN, date of birth, ID band Patient awake    Reviewed: Allergy & Precautions, NPO status , Patient's Chart, lab work & pertinent test results  History of Anesthesia Complications Negative for: history of anesthetic complications  Airway Mallampati: I   Neck ROM: Full    Dental  (+) Chipped   Pulmonary asthma , sleep apnea and Continuous Positive Airway Pressure Ventilation , former smoker (quit 2022)   Pulmonary exam normal breath sounds clear to auscultation       Cardiovascular Exercise Tolerance: Good Normal cardiovascular exam Rhythm:Regular Rate:Normal  ECG 10/11/23: NSR; nonspecific ST abnormality   Neuro/Psych  PSYCHIATRIC DISORDERS (ADD) Anxiety     negative neurological ROS     GI/Hepatic negative GI ROS,,,  Endo/Other  Obesity; prediabetes   Renal/GU negative Renal ROS     Musculoskeletal  (+) Arthritis , Rheumatoid disorders,    Abdominal   Peds  Hematology negative hematology ROS (+)   Anesthesia Other Findings Last dose of Tirzepatide  08/20/24.  Reproductive/Obstetrics                              Anesthesia Physical Anesthesia Plan  ASA: 2  Anesthesia Plan: General   Post-op Pain Management:    Induction: Intravenous  PONV Risk Score and Plan: 3 and Ondansetron , Dexamethasone  and Treatment may vary due to age or medical condition  Airway Management Planned: LMA  Additional Equipment:   Intra-op Plan:   Post-operative Plan: Extubation in OR  Informed Consent: I have reviewed the patients History and Physical, chart, labs and discussed the procedure including the risks, benefits and alternatives for the proposed anesthesia with the patient or authorized representative who has indicated his/her understanding and acceptance.     Dental advisory given  Plan Discussed with: CRNA  Anesthesia Plan Comments: (Patient  consented for risks of anesthesia including but not limited to:  - adverse reactions to medications - damage to eyes, teeth, lips or other oral mucosa - nerve damage due to positioning  - sore throat or hoarseness - damage to heart, brain, nerves, lungs, other parts of body or loss of life  Informed patient about role of CRNA in peri- and intra-operative care.  Patient voiced understanding.)         Anesthesia Quick Evaluation  "

## 2024-08-27 NOTE — Transfer of Care (Signed)
 Immediate Anesthesia Transfer of Care Note  Patient: Stacy Ross  Procedure(s) Performed: DILATATION AND CURETTAGE /HYSTEROSCOPY (Uterus)  Patient Location: PACU  Anesthesia Type:General  Level of Consciousness: drowsy and patient cooperative  Airway & Oxygen Therapy: Patient Spontanous Breathing and Patient connected to nasal cannula oxygen  Post-op Assessment: Report given to RN and Post -op Vital signs reviewed and stable  Post vital signs: Reviewed and stable  Last Vitals:  Vitals Value Taken Time  BP 119/82 08/27/24 12:33  Temp    Pulse 55 08/27/24 12:38  Resp 14 08/27/24 12:38  SpO2 96 % 08/27/24 12:38  Vitals shown include unfiled device data.  Last Pain:  Vitals:   08/27/24 0958  TempSrc: Temporal         Complications: No notable events documented.

## 2024-08-27 NOTE — Anesthesia Postprocedure Evaluation (Signed)
"   Anesthesia Post Note  Patient: Stacy Ross  Procedure(s) Performed: DILATATION AND CURETTAGE /HYSTEROSCOPY WITH MYOSURE (Uterus)  Patient location during evaluation: PACU Anesthesia Type: General Level of consciousness: awake and alert, oriented and patient cooperative Pain management: pain level controlled Vital Signs Assessment: post-procedure vital signs reviewed and stable Respiratory status: spontaneous breathing, nonlabored ventilation and respiratory function stable Cardiovascular status: blood pressure returned to baseline and stable Postop Assessment: adequate PO intake Anesthetic complications: no   No notable events documented.   Last Vitals:  Vitals:   08/27/24 1300 08/27/24 1315  BP: 133/81 128/76  Pulse: (!) 55 63  Resp: 10 17  Temp:  (!) 36.4 C  SpO2: 98% 96%    Last Pain:  Vitals:   08/27/24 1315  TempSrc:   PainSc: 4                  Alfonso Ruths      "

## 2024-08-27 NOTE — Op Note (Signed)
 Operative Note   Name: Stacy Ross  Date of Service: 08/27/2024  DOB: 05-Jul-1976  MRN: [id number]   PRE-OP DIAGNOSIS: Postmenopausal bleeding   POST-OP DIAGNOSIS: Postmenopausal bleeding   SURGEON: Surgeons and Role:    DEWAINE Leonce Garnette JONETTA, MD - Primary  PROCEDURE: Procedures: DILATATION AND CURETTAGE /HYSTEROSCOPY   ANESTHESIA: General LMA  ESTIMATED BLOOD LOSS: 5 mL  DRAINS: none   TOTAL IV FLUIDS: 300 mL  SPECIMENS:  Endometrial curettings  VTE PROPHYLAXIS: SCDs to the bilateral lower extremities  ANTIBIOTICS: none indicated, none given  FLUID DEFICIT: minimal  COMPLICATIONS: none  DISPOSITION: PACU - hemodynamically stable.  CONDITION: stable  INDICATION: 48 y.o. menopausal female with postmenopausal bleeding. She has failed in-office biopsy attempt and has had continued bleeding. Mutual decision made to take her to the OR for better visualization and sampling of the uterine lining.   FINDINGS: Exam under anesthesia revealed small, mobile anteverted uterus with no masses and bilateral adnexa without masses or fullness. Hysteroscopy revealed a grossly normal appearing uterine cavity with bilateral tubal ostia and normal appearing endocervical canal.  PROCEDURE IN DETAIL:  After informed consent was obtained, the patient was taken to the operating room where anesthesia was obtained without difficulty. The patient was positioned in the dorsal lithotomy position in [place] stirrups.  The patient's bladder was catheterized with an in and out foley catheter.  The patient was examined under anesthesia, with the above noted findings.  The bi-valved speculum was placed inside the patient's vagina, and the the anterior lip of the cervix was grasped with the tenaculum.  Then the cervix was progressively dilated to a 7 mm Hegar dilator.  The hysteroscope was introduced, with the above noted findings.The MyoSure Reach device was utilized to sample the area near the bilateral ostia and  any areas of thickened tissue. The hystersocope was removed and the uterine cavity was curetted until a gritty texture was noted, yielding a small amount of endometrial curettings.  Excellent hemostasis was noted. The tenaculum was removed and hemostasis was obtained with the application of silver  nitrate. The vagina was verified to be clear of instruments and sponges.  She was then taken out of dorsal lithotomy.  The patient tolerated the procedure well.  Sponge, lap and needle counts were correct x2.  The patient was taken to recovery room in excellent condition.  Garnette CHARM Leonce, MD, FACOG 08/27/2024 12:25 PM

## 2024-08-27 NOTE — Interval H&P Note (Signed)
 History and Physical Interval Note:  08/27/2024 11:25 AM  Stacy Ross  has presented today for surgery, with the diagnosis of postmenopausal bleeding.  The various methods of treatment have been discussed with the patient and family. After consideration of risks, benefits and other options for treatment, the patient has consented to  Procedures with comments: DILATATION AND CURETTAGE /HYSTEROSCOPY (N/A) - removal of abnormal tissue as a surgical intervention.  The patient's history has been reviewed, patient examined, no change in status, stable for surgery.  I have reviewed the patient's chart and labs.  Questions were answered to the patient's satisfaction.    Garnette Mace, MD, [facility] 08/27/2024 11:25 AM

## 2024-08-28 ENCOUNTER — Encounter: Payer: Self-pay | Admitting: Obstetrics and Gynecology

## 2024-08-30 LAB — SURGICAL PATHOLOGY

## 2024-09-06 ENCOUNTER — Other Ambulatory Visit: Payer: Self-pay | Admitting: Student

## 2024-09-06 DIAGNOSIS — G4733 Obstructive sleep apnea (adult) (pediatric): Secondary | ICD-10-CM

## 2024-09-09 NOTE — Telephone Encounter (Signed)
 Requested by interface surescripts. Medication dose discontinued 08/22/24. Requested Prescriptions  Refused Prescriptions Disp Refills   ZEPBOUND  5 MG/0.5ML injection vial [Pharmacy Med Name: ZEPBOUND  5 MG/0.5ML SUBCUTANEOUS SOLUTION] 2 mL 0    Sig: INJECT 0.5 ML (5 MG) UNDER THE SKIN ONCE WEEKLY (0.5ML= 50 UNITS)     Off-Protocol Failed - 09/09/2024  9:59 AM      Failed - Medication not assigned to a protocol, review manually.      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

## 2024-10-01 ENCOUNTER — Other Ambulatory Visit: Payer: Self-pay | Admitting: Student

## 2024-10-01 ENCOUNTER — Encounter: Payer: Self-pay | Admitting: Student

## 2024-10-01 MED ORDER — TIRZEPATIDE-WEIGHT MANAGEMENT 10 MG/0.5ML ~~LOC~~ SOLN: 10.0000 mg | SUBCUTANEOUS | 3 refills | Status: AC

## 2024-10-01 NOTE — Telephone Encounter (Signed)
 Please review and send RX please

## 2024-12-06 ENCOUNTER — Ambulatory Visit: Admitting: Student
